# Patient Record
Sex: Female | Born: 1935 | Hispanic: No | Marital: Married | Smoking: Never smoker
Health system: Southern US, Community
[De-identification: ages and names within clinical notes are randomized; demographics above are authoritative.]

## PROBLEM LIST (undated history)

## (undated) DIAGNOSIS — I350 Nonrheumatic aortic (valve) stenosis: Secondary | ICD-10-CM

## (undated) DIAGNOSIS — C801 Malignant (primary) neoplasm, unspecified: Secondary | ICD-10-CM

## (undated) HISTORY — PX: CARDIAC SURGERY: SHX584

## (undated) HISTORY — PX: BREAST SURGERY: SHX581

---

## 2018-01-30 ENCOUNTER — Emergency Department (HOSPITAL_COMMUNITY): Payer: PRIVATE HEALTH INSURANCE

## 2018-01-30 ENCOUNTER — Other Ambulatory Visit: Payer: Self-pay

## 2018-01-30 ENCOUNTER — Inpatient Hospital Stay (HOSPITAL_COMMUNITY)
Admission: EM | Admit: 2018-01-30 | Discharge: 2018-02-01 | DRG: 292 | Disposition: A | Discharge status: Home / Self Care | Payer: PRIVATE HEALTH INSURANCE | Attending: Internal Medicine | Admitting: Internal Medicine

## 2018-01-30 ENCOUNTER — Encounter (HOSPITAL_COMMUNITY): Payer: Self-pay

## 2018-01-30 DIAGNOSIS — R011 Cardiac murmur, unspecified: Secondary | ICD-10-CM | POA: Diagnosis present

## 2018-01-30 DIAGNOSIS — R1310 Dysphagia, unspecified: Secondary | ICD-10-CM | POA: Diagnosis present

## 2018-01-30 DIAGNOSIS — R7989 Other specified abnormal findings of blood chemistry: Secondary | ICD-10-CM | POA: Diagnosis present

## 2018-01-30 DIAGNOSIS — I350 Nonrheumatic aortic (valve) stenosis: Secondary | ICD-10-CM | POA: Diagnosis present

## 2018-01-30 DIAGNOSIS — E041 Nontoxic single thyroid nodule: Secondary | ICD-10-CM | POA: Diagnosis present

## 2018-01-30 DIAGNOSIS — I2510 Atherosclerotic heart disease of native coronary artery without angina pectoris: Secondary | ICD-10-CM | POA: Diagnosis present

## 2018-01-30 DIAGNOSIS — Z7984 Long term (current) use of oral hypoglycemic drugs: Secondary | ICD-10-CM

## 2018-01-30 DIAGNOSIS — J9811 Atelectasis: Secondary | ICD-10-CM | POA: Diagnosis present

## 2018-01-30 DIAGNOSIS — I10 Essential (primary) hypertension: Secondary | ICD-10-CM | POA: Diagnosis not present

## 2018-01-30 DIAGNOSIS — Z66 Do not resuscitate: Secondary | ICD-10-CM | POA: Diagnosis present

## 2018-01-30 DIAGNOSIS — Z853 Personal history of malignant neoplasm of breast: Secondary | ICD-10-CM | POA: Diagnosis not present

## 2018-01-30 DIAGNOSIS — E119 Type 2 diabetes mellitus without complications: Secondary | ICD-10-CM

## 2018-01-30 DIAGNOSIS — R591 Generalized enlarged lymph nodes: Secondary | ICD-10-CM

## 2018-01-30 DIAGNOSIS — R0602 Shortness of breath: Secondary | ICD-10-CM

## 2018-01-30 DIAGNOSIS — I248 Other forms of acute ischemic heart disease: Secondary | ICD-10-CM | POA: Diagnosis present

## 2018-01-30 DIAGNOSIS — E785 Hyperlipidemia, unspecified: Secondary | ICD-10-CM | POA: Diagnosis present

## 2018-01-30 DIAGNOSIS — J90 Pleural effusion, not elsewhere classified: Secondary | ICD-10-CM | POA: Diagnosis not present

## 2018-01-30 DIAGNOSIS — Z951 Presence of aortocoronary bypass graft: Secondary | ICD-10-CM

## 2018-01-30 DIAGNOSIS — I5033 Acute on chronic diastolic (congestive) heart failure: Secondary | ICD-10-CM | POA: Diagnosis present

## 2018-01-30 DIAGNOSIS — I340 Nonrheumatic mitral (valve) insufficiency: Secondary | ICD-10-CM | POA: Diagnosis not present

## 2018-01-30 DIAGNOSIS — I459 Conduction disorder, unspecified: Secondary | ICD-10-CM | POA: Diagnosis present

## 2018-01-30 DIAGNOSIS — I440 Atrioventricular block, first degree: Secondary | ICD-10-CM | POA: Diagnosis present

## 2018-01-30 DIAGNOSIS — Z79899 Other long term (current) drug therapy: Secondary | ICD-10-CM

## 2018-01-30 DIAGNOSIS — R778 Other specified abnormalities of plasma proteins: Secondary | ICD-10-CM | POA: Diagnosis present

## 2018-01-30 DIAGNOSIS — R748 Abnormal levels of other serum enzymes: Secondary | ICD-10-CM | POA: Diagnosis not present

## 2018-01-30 DIAGNOSIS — R918 Other nonspecific abnormal finding of lung field: Secondary | ICD-10-CM | POA: Diagnosis present

## 2018-01-30 DIAGNOSIS — J449 Chronic obstructive pulmonary disease, unspecified: Secondary | ICD-10-CM | POA: Diagnosis present

## 2018-01-30 DIAGNOSIS — I5023 Acute on chronic systolic (congestive) heart failure: Secondary | ICD-10-CM | POA: Diagnosis not present

## 2018-01-30 DIAGNOSIS — R599 Enlarged lymph nodes, unspecified: Secondary | ICD-10-CM | POA: Diagnosis present

## 2018-01-30 DIAGNOSIS — I110 Hypertensive heart disease with heart failure: Principal | ICD-10-CM | POA: Diagnosis present

## 2018-01-30 DIAGNOSIS — E118 Type 2 diabetes mellitus with unspecified complications: Secondary | ICD-10-CM | POA: Diagnosis not present

## 2018-01-30 LAB — CBC WITH DIFFERENTIAL/PLATELET
BASOS ABS: 0 10*3/uL (ref 0.0–0.1)
BASOS PCT: 0 %
EOS ABS: 0.2 10*3/uL (ref 0.0–0.7)
Eosinophils Relative: 3 %
HEMATOCRIT: 35.2 % — AB (ref 36.0–46.0)
Hemoglobin: 11.3 g/dL — ABNORMAL LOW (ref 12.0–15.0)
Lymphocytes Relative: 25 %
Lymphs Abs: 2 10*3/uL (ref 0.7–4.0)
MCH: 29.4 pg (ref 26.0–34.0)
MCHC: 32.1 g/dL (ref 30.0–36.0)
MCV: 91.4 fL (ref 78.0–100.0)
Monocytes Absolute: 0.5 10*3/uL (ref 0.1–1.0)
Monocytes Relative: 6 %
NEUTROS ABS: 5.3 10*3/uL (ref 1.7–7.7)
NEUTROS PCT: 66 %
Platelets: 243 10*3/uL (ref 150–400)
RBC: 3.85 MIL/uL — ABNORMAL LOW (ref 3.87–5.11)
RDW: 14.1 % (ref 11.5–15.5)
WBC: 7.9 10*3/uL (ref 4.0–10.5)

## 2018-01-30 LAB — BASIC METABOLIC PANEL
ANION GAP: 10 (ref 5–15)
BUN: 15 mg/dL (ref 8–23)
CALCIUM: 9.2 mg/dL (ref 8.9–10.3)
CO2: 27 mmol/L (ref 22–32)
Chloride: 105 mmol/L (ref 98–111)
Creatinine, Ser: 0.84 mg/dL (ref 0.44–1.00)
Glucose, Bld: 111 mg/dL — ABNORMAL HIGH (ref 70–99)
Potassium: 4 mmol/L (ref 3.5–5.1)
SODIUM: 142 mmol/L (ref 135–145)

## 2018-01-30 LAB — POCT I-STAT TROPONIN I: TROPONIN I, POC: 0.57 ng/mL — AB (ref 0.00–0.08)

## 2018-01-30 LAB — D-DIMER, QUANTITATIVE (NOT AT ARMC): D DIMER QUANT: 0.86 ug{FEU}/mL — AB (ref 0.00–0.50)

## 2018-01-30 LAB — TROPONIN I: Troponin I: 0.52 ng/mL (ref <0.03)

## 2018-01-30 LAB — BRAIN NATRIURETIC PEPTIDE: B NATRIURETIC PEPTIDE 5: 629.1 pg/mL — AB (ref 0.0–100.0)

## 2018-01-30 LAB — GLUCOSE, CAPILLARY: GLUCOSE-CAPILLARY: 111 mg/dL — AB (ref 70–99)

## 2018-01-30 MED ORDER — IOPAMIDOL (ISOVUE-370) INJECTION 76%
INTRAVENOUS | Status: AC
  Filled 2018-01-30: qty 100

## 2018-01-30 MED ORDER — ATORVASTATIN CALCIUM 40 MG PO TABS
80.0000 mg | ORAL_TABLET | Freq: Every day | ORAL | Status: DC
  Administered 2018-01-30 – 2018-01-31 (×2): 80 mg via ORAL
  Filled 2018-01-30: qty 2
  Filled 2018-01-30: qty 1
  Filled 2018-01-30 (×2): qty 2

## 2018-01-30 MED ORDER — INSULIN ASPART 100 UNIT/ML ~~LOC~~ SOLN: 0.0000 [IU] | Freq: Every day | SUBCUTANEOUS | Status: DC

## 2018-01-30 MED ORDER — IOPAMIDOL (ISOVUE-370) INJECTION 76%
100.0000 mL | Freq: Once | INTRAVENOUS | Status: AC | PRN
  Administered 2018-01-30: 100 mL via INTRAVENOUS

## 2018-01-30 MED ORDER — IPRATROPIUM-ALBUTEROL 0.5-2.5 (3) MG/3ML IN SOLN: 3.0000 mL | RESPIRATORY_TRACT | Status: DC | PRN

## 2018-01-30 MED ORDER — INSULIN ASPART 100 UNIT/ML ~~LOC~~ SOLN: 0.0000 [IU] | Freq: Three times a day (TID) | SUBCUTANEOUS | Status: DC

## 2018-01-30 MED ORDER — ACETAMINOPHEN 325 MG PO TABS: 650.0000 mg | ORAL_TABLET | Freq: Four times a day (QID) | ORAL | Status: DC | PRN

## 2018-01-30 MED ORDER — ACETAMINOPHEN 650 MG RE SUPP: 650.0000 mg | Freq: Four times a day (QID) | RECTAL | Status: DC | PRN

## 2018-01-30 MED ORDER — PANTOPRAZOLE SODIUM 40 MG PO TBEC
40.0000 mg | DELAYED_RELEASE_TABLET | Freq: Every day | ORAL | Status: DC
Start: 2018-01-30 — End: 2018-02-01
  Administered 2018-01-31 – 2018-02-01 (×2): 40 mg via ORAL
  Filled 2018-01-30 (×2): qty 1

## 2018-01-30 MED ORDER — ONDANSETRON HCL 4 MG/2ML IJ SOLN: 4.0000 mg | Freq: Four times a day (QID) | INTRAMUSCULAR | Status: DC | PRN

## 2018-01-30 MED ORDER — TRIAMTERENE-HCTZ 75-50 MG PO TABS: 1.0000 | ORAL_TABLET | Freq: Every day | ORAL | Status: DC

## 2018-01-30 MED ORDER — FUROSEMIDE 10 MG/ML IJ SOLN
40.0000 mg | Freq: Two times a day (BID) | INTRAMUSCULAR | Status: DC
  Administered 2018-01-31: 40 mg via INTRAVENOUS
  Filled 2018-01-30: qty 4

## 2018-01-30 MED ORDER — ONDANSETRON HCL 4 MG PO TABS: 4.0000 mg | ORAL_TABLET | Freq: Four times a day (QID) | ORAL | Status: DC | PRN

## 2018-01-30 MED ORDER — FELODIPINE ER 2.5 MG PO TB24
2.5000 mg | ORAL_TABLET | Freq: Every day | ORAL | Status: DC
  Administered 2018-01-31 – 2018-02-01 (×2): 2.5 mg via ORAL
  Filled 2018-01-30 (×2): qty 1

## 2018-01-30 NOTE — ED Triage Notes (Signed)
She c/o shortness of breath x 2 days. She has recently flown here from [place]. She is in no distress.

## 2018-01-30 NOTE — H&P (Signed)
History and Physical    Katrina Sims QQI:[id number] DOB: 1931-09-25 DOA: 01/30/2018  PCP: Patient, No Pcp Per   Patient coming from: Home  I have personally briefly reviewed patient's old medical records in [place]  Chief Complaint: Shortness of breath  HPI: Katrina Sims is a 84 y.o. female with medical history significant of coronary artery disease status post CABG 20 years ago, breast cancer in remission, hypertension, hyperlipidemia and diabetes presented with complaints of shortness of breath.  Patient lives in [place] and traveled here to visit family 2 days ago.  Patient speaks [place] and speaks very little [place], most of the history is obtained from the son who is translating.  Son reports that patient has been complaining of shortness of breath and fatigue for the last 2 days.  There is no reported history of chest pain, increased leg swelling, decreased urine output, fever, worsening cough, nausea, vomiting or diaphoresis.  Son also reports that patient has lost some weight recently and has difficulty swallowing solid foods occasionally.  Patient had recently seen her family doctor 1 week prior to leaving [place] and was told that everything was fine.  There is no history of diarrhea, dysuria, loss of consciousness, seizures, syncope.  ED Course: Patient was found to have a positive troponin of 0.57.  CT of the chest was negative for pulmonary embolism but showed bilateral pleural effusion, multiple pulmonary nodules, thoracic adenopathy and thyroid mass.  Hospitalist service was called to evaluate the patient  Review of Systems: As per HPI otherwise 10 point review of systems negative.   Past medical and surgical history -Coronary disease status post CABG -Hypertension -diabetes mellitus type 2 -Dyslipidemia -Breast cancer currently in remission  Social history  reports that she has never smoked. She has never used smokeless tobacco. Her alcohol and drug  histories are not on file.  -Patient lives in [place] and is currently visiting her family  No Known Allergies  Family history -No history of TB  Prior to Admission medications   Medication Sig Start Date End Date Taking? Authorizing Provider  amiloride-hydrochlorothiazide (MODURETIC) 5-50 MG tablet Take 0.5 tablets by mouth daily.   Yes [provider]  atorvastatin (LIPITOR) 80 MG tablet Take 80 mg by mouth daily.   Yes [provider]  felodipine (PLENDIL) 2.5 MG 24 hr tablet Take 2.5 mg by mouth daily.   Yes [provider]  metFORMIN (GLUCOPHAGE) 850 MG tablet Take 850 mg by mouth 2 (two) times daily with a meal.   Yes [provider]  OMEPRAZOLE PO Take 1 capsule by mouth daily.   Yes [provider]  PRESCRIPTION MEDICATION Take 200 mg by mouth daily. [place] Cardio = ASA/Aluminum/Magnesium. Dosage forms 200/60/30 or 100/30/15 [place] product of Aspirin plus Antacid   Yes [provider]  Pyridoxine HCl (VITAMIN B-6 PO) Take 1 tablet by mouth daily.   Yes [provider]    Physical Exam: Vitals:   01/30/18 1232 01/30/18 1315 01/30/18 1500  BP: 134/64 124/60 131/71  Pulse: 95  81  Resp: 16 (!) 28 (!) 26  Temp: 97.7 F (36.5 C)    SpO2: 98%  99%    Constitutional: NAD, calm, comfortable.  Thinly built elderly female lying in bed Vitals:   01/30/18 1232 01/30/18 1315 01/30/18 1500  BP: 134/64 124/60 131/71  Pulse: 95  81  Resp: 16 (!) 28 (!) 26  Temp: 97.7 F (36.5 C)    SpO2: 98%  99%  Eyes: PERRL, lids and conjunctivae normal ENMT: Mucous membranes are moist. Posterior pharynx clear of any exudate or lesions. Neck: normal, supple, no masses, no thyromegaly Respiratory: bilateral decreased breath sounds at bases, with basilar crackles but no wheezing.  Normal respiratory effort. No accessory muscle use.  Intermittent tachypnea Cardiovascular: S1 S2 positive, rate controlled. No extremity edema. 2+  pedal pulses.  Abdomen: no tenderness, no masses palpated. No hepatosplenomegaly. Bowel sounds positive.  Musculoskeletal: no clubbing / cyanosis. No joint deformity upper and lower extremities.  Skin: no rashes, lesions, ulcers. No induration Neurologic: CN 2-12 grossly intact. Moving extremities. No focal neurologic deficits.  Lymph: No cervical lymphadenopathy   Labs on Admission: I have personally reviewed following labs and imaging studies  CBC: Recent Labs  Lab 01/30/18 1339  WBC 7.9  NEUTROABS 5.3  HGB 11.3*  HCT 35.2*  MCV 91.4  PLT 409   Basic Metabolic Panel: Recent Labs  Lab 01/30/18 1339  NA 142  K 4.0  CL 105  CO2 27  GLUCOSE 111*  BUN 15  CREATININE 0.84  CALCIUM 9.2   GFR: CrCl cannot be calculated (Unknown ideal weight.). Liver Function Tests: No results for input(s): AST, ALT, ALKPHOS, BILITOT, PROT, ALBUMIN in the last 168 hours. No results for input(s): LIPASE, AMYLASE in the last 168 hours. No results for input(s): AMMONIA in the last 168 hours. Coagulation Profile: No results for input(s): INR, PROTIME in the last 168 hours. Cardiac Enzymes: No results for input(s): CKTOTAL, CKMB, CKMBINDEX, TROPONINI in the last 168 hours. BNP (last 3 results) No results for input(s): PROBNP in the last 8760 hours. HbA1C: No results for input(s): HGBA1C in the last 72 hours. CBG: No results for input(s): GLUCAP in the last 168 hours. Lipid Profile: No results for input(s): CHOL, HDL, LDLCALC, TRIG, CHOLHDL, LDLDIRECT in the last 72 hours. Thyroid Function Tests: No results for input(s): TSH, T4TOTAL, FREET4, T3FREE, THYROIDAB in the last 72 hours. Anemia Panel: No results for input(s): VITAMINB12, FOLATE, FERRITIN, TIBC, IRON, RETICCTPCT in the last 72 hours. Urine analysis: No results found for: COLORURINE, APPEARANCEUR, LABSPEC, PHURINE, GLUCOSEU, HGBUR, BILIRUBINUR, KETONESUR, PROTEINUR, UROBILINOGEN, NITRITE, LEUKOCYTESUR  Radiological Exams on  Admission: Dg Chest 2 View  Result Date: 01/30/2018 CLINICAL DATA:  Acute shortness of breath for 2 days. EXAM: CHEST - 2 VIEW COMPARISON:  None. FINDINGS: UPPER limits normal heart size and CABG changes noted. Mild peribronchial thickening and mild hyperinflation noted. There is no evidence of focal airspace disease, pulmonary edema, suspicious pulmonary nodule/mass, pleural effusion, or pneumothorax. No acute bony abnormalities are identified. IMPRESSION: UPPER limits normal heart size without definite acute abnormality. Mild peribronchial thickening (likely chronic) and mild hyperinflation. Electronically Signed   By: Margarette Canada M.D.   On: 01/30/2018 13:28   Ct Angio Chest Pe W And/or Wo Contrast  Result Date: 01/30/2018 CLINICAL DATA:  Shortness of breath EXAM: CT ANGIOGRAPHY CHEST WITH CONTRAST TECHNIQUE: Multidetector CT imaging of the chest was performed using the standard protocol during bolus administration of intravenous contrast. Multiplanar CT image reconstructions and MIPs were obtained to evaluate the vascular anatomy. CONTRAST:  90 mL ISOVUE-370 IOPAMIDOL (ISOVUE-370) INJECTION 76% COMPARISON:  Chest radiograph January 30, 2018 FINDINGS: Cardiovascular: There is no demonstrable pulmonary embolus. There is no thoracic aortic aneurysm. No dissection evident, although the contrast bolus is less than optimal for assessment for potential thoracic aortic dissection. There are patchy areas of calcification in the proximal great vessels. Patchy calcification is also noted in mid great vessel regions.  There is extensive aortic atherosclerosis throughout the aorta. There are foci of native coronary artery calcification. Patient is status post coronary artery bypass grafting. There is no pericardial effusion or pericardial thickening evident. The main pulmonary outflow tract measures 3.1 cm, prominent. Mediastinum/Nodes: There is diffuse thyroid enlargement with multiple masses arising from the thyroid,  largest measuring 3.1 x 2.7 cm on the right. There is adenopathy in the aortopulmonary window region with the largest lymph node measuring 1.9 x 1.1 cm. There is a lymph node to the left of the distal trachea measuring 1.5 x 1.2 cm. There is extensive adenopathy in the subcarinal region with a conglomeration of lymph nodes measuring 3.2 x 2.2 cm. Lungs/Pleura: There are moderate free-flowing pleural effusions bilaterally. There are scattered areas of apparent lung scarring. There is a nodular opacity in the anterior segment of the right middle lobe measuring 9 x 7 mm, best seen on axial slice 86 series 6. There is a nodular opacity in the lateral segment of the right middle lobe measuring 9 x 8 mm, best seen on axial slice 97 series 6. There is a nodular opacity in the anterior segment of the left lower lobe, best seen on axial slice 98 series 6 measuring 10 x 9 mm. There is a nodular opacity in the lateral segment of the left lower lobe seen on axial slice 093 series 6 measuring 8 x 7 mm. There is an ill-defined nodular opacity in the inferior lingula best seen on axial slice 91 series 6 measuring 10 x 9 mm. There is an ill-defined nodular opacity in the lateral segment right lower lobe on axial slice 99 series 6 measuring 5 x 4 mm. There is patchy atelectatic change in both upper and lower lobes. There is no frank airspace consolidation. There is a degree of underlying centrilobular emphysema. Upper Abdomen: There is upper abdominal atherosclerotic plaque and calcification. Visualized upper abdominal structures otherwise appear unremarkable. Adrenals and particular appear normal. Musculoskeletal: Patient is status post median sternotomy. No blastic or lytic bone lesions are evident. No chest wall lesions are appreciable. Review of the MIP images confirms the above findings. IMPRESSION: 1. No demonstrable pulmonary embolus. No thoracic aortic aneurysm or dissection. Note that the contrast bolus is less than optimal  for assessment for potential dissection. There is aortic atherosclerosis. Patient is status post coronary artery bypass grafting. There are multiple foci of great vessel calcification. 2. Several scattered pulmonary nodular lesions, largest measuring approximately 1 cm. This finding, in concert with the adenopathy, warrants further evaluation. Advise PET-CT examination to further assess given concern for neoplastic etiology. 3. Multifocal thoracic adenopathy, most marked in the subcarinal region or adenopathy measures 3.2 x 2.2 cm. Correlation with PET-CT examination is felt to be warranted in this circumstance. 4. Moderate free-flowing pleural effusions bilaterally. Areas of atelectatic change. No frank consolidation. Underlying centrilobular emphysematous change noted. 5. Prominence of the main pulmonary outflow tract, a finding felt to represent a degree of pulmonary arterial hypertension. 6. **An incidental finding of potential clinical significance has been found. Diffusely enlarged thyroid with dominant masses, largest on the right measuring 3.1 x 2.7 cm. Consider further evaluation with nonemergent thyroid ultrasound. If patient is clinically hyperthyroid, consider nuclear medicine thyroid uptake and scan.** it should be noted that thyroid neoplasm potentially could cause adenopathy and parenchymal lung nodular lesions. Aortic Atherosclerosis (ICD10-I70.0) and Emphysema (ICD10-J43.9). Electronically Signed   By: Lowella Grip III M.D.   On: 01/30/2018 15:07    EKG: Independently reviewed.  Normal sinus rhythm with no ST elevations or depressions  Assessment/Plan Active Problems:   Bilateral pleural effusion   Lung nodules   Adenopathy   Elevated troponin   HTN (hypertension)   Diabetes mellitus type 2, controlled (HCC)   Shortness of breath most likely due to combination of pleural effusion/lung nodule/thoracic adenopathy -Currently not requiring oxygen segmentation.  Use oxygen if needed;  plan as below  Bilateral pleural effusions -Unclear if this is related to malignancy.  Currently no signs of heart failure. -We will request ultrasound-guided thoracentesis and pleural fluid to be sent for analysis including protein/LDH/cytology/Gram stain/cultures.  Spoke to Dr. Minna Merritts and he agreed that patient needed a thoracentesis and requested IR guided thoracentesis and consult pulmonary officially once thoracentesis is done -2D echo. -We will empirically start intravenous Lasix to see if it might help with pleural effusions.  Pulmonary nodules with thoracic adenopathy with thyroid mass -Spoke to Dr. Sherill/oncology about the patient and he recommended that patient would benefit from thoracentesis for tissue diagnosis.  He stated that it would be rare for thyroid cancer to present with pulmonary metastases and thought that it might be breast cancer with metastases.  Oncology needs to be officially consulted once we have a tissue diagnosis  Positive troponins in a patient with history of coronary artery disease and CABG -Most likely secondary demand ischemia from shortness of breath/pleural effusions.  No active chest pain.  EKG is unremarkable.  Cycle troponins.  2D echo.  Might need cardiology evaluation.  N.p.o. past midnight  Hypertension -Monitor blood pressure.  Continue home regimen  Diabetes mellitus type 2 -Hold metformin.  Accu-Cheks with insulin sliding scale coverage  Hyperlipidemia -Continue statin  DVT prophylaxis: SCDs.  Avoid Lovenox as patient might need thoracentesis Code Status: Full code as confirmed by the patient in front of the son Family Communication: Spoke to son at bedside Disposition Plan: Home once clinically improved Consults called: Spoke to Dr. Sherill/oncology and Dr. Minna Merritts on phone Admission status: Inpatient/telemetry  Severity of Illness: The appropriate patient status for this patient is INPATIENT. Inpatient status is judged  to be reasonable and necessary in order to provide the required intensity of service to ensure the patient's safety. The patient's presenting symptoms, physical exam findings, and initial radiographic and laboratory data in the context of their chronic comorbidities is felt to place them at high risk for further clinical deterioration. Furthermore, it is not anticipated that the patient will be medically stable for discharge from the hospital within 2 midnights of admission. The following factors support the patient status of inpatient.   " The patient's presenting symptoms include shortness of breath. " The worrisome physical exam findings include intermittent tachypnea. " The initial radiographic and laboratory data are worrisome because of pleural effusions/pulmonary nodule/thoracic adenopathy/thyroid mass/positive troponins. " The chronic co-morbidities include history of coronary artery disease and CABG/hypertension/diabetes.   * I certify that at the point of admission it is my clinical judgment that the patient will require inpatient hospital care spanning beyond 2 midnights from the point of admission due to high intensity of service, high risk for further deterioration and high frequency of surveillance required.Aline August MD [facility] Pager [phone]  If 7PM-7AM, please contact night-coverage [email or web address] Password [facility]  01/30/2018, 4:54 PM

## 2018-01-30 NOTE — Progress Notes (Signed)
CRITICAL VALUE ALERT  Critical Value:  Troponin 0.52  Date & Time Notied: 01/30/2018  Provider Notified: OnCall   Orders Received/Actions taken:

## 2018-01-30 NOTE — ED Provider Notes (Signed)
[place] DEPT Provider Note   CSN: [id number] Arrival date & time: 01/30/18  1218     History   Chief Complaint Chief Complaint  Patient presents with  . Shortness of Breath    HPI Katrina Sims is a 84 y.o. female.  84 year old female with prior medical history significant for CAD, breast cancer (in remission), hypertension, hyperlipidemia, and diabetes presents with complaint of shortness of breath.  Patient resides in [place] and traveled here to visit family 2 days prior.  49 of history is obtained through the patient's son who is translating.  Patient speaks [place].  Son reports that the patient has been complaining of feeling short of breath and fatigue for the last 2 days.  No reported fever.  No reported chest pain, nausea, vomiting, and/or diaphoresis.  Patient reportedly saw her family doctor approximately 1 week prior to leaving [place] and was told that everything was "fine."  The history is provided by the patient and a relative.  Shortness of Breath  This is a new problem. The average episode lasts 2 days. The problem occurs rarely.The current episode started 2 days ago. The problem has not changed since onset.Pertinent negatives include no fever, no cough, no chest pain and no leg pain. She has tried nothing for the symptoms.    History reviewed. No pertinent past medical history.  There are no active problems to display for this patient.   OB History   None      Home Medications    Prior to Admission medications   Medication Sig Start Date End Date Taking? Authorizing Provider  amiloride-hydrochlorothiazide (MODURETIC) 5-50 MG tablet Take 0.5 tablets by mouth daily.   Yes [provider]  atorvastatin (LIPITOR) 80 MG tablet Take 80 mg by mouth daily.   Yes [provider]  felodipine (PLENDIL) 2.5 MG 24 hr tablet Take 2.5 mg by mouth daily.   Yes [provider]  metFORMIN  (GLUCOPHAGE) 850 MG tablet Take 850 mg by mouth 2 (two) times daily with a meal.   Yes [provider]  OMEPRAZOLE PO Take 1 capsule by mouth daily.   Yes [provider]  PRESCRIPTION MEDICATION Take 200 mg by mouth daily. [place] [facility]   Yes [provider]  Pyridoxine HCl (VITAMIN B-6 PO) Take 1 tablet by mouth daily.   Yes [provider]    Family History No family history on file.  Social History Social History   Tobacco Use  . Smoking status: Never Smoker  . Smokeless tobacco: Never Used  Substance Use Topics  . Alcohol use: Not on file  . Drug use: Not on file     Allergies   Patient has no known allergies.   Review of Systems Review of Systems  Constitutional: Negative for fever.  Respiratory: Positive for shortness of breath. Negative for cough.   Cardiovascular: Negative for chest pain.  All other systems reviewed and are negative.    Physical Exam Updated Vital Signs BP 124/60   Pulse 95   Temp 97.7 F (36.5 C)   Resp (!) 28   SpO2 98%   Physical Exam  Constitutional: She is oriented to person, place, and time. She appears well-developed and well-nourished. No distress.  HENT:  Head: Normocephalic and atraumatic.  Mouth/Throat: Oropharynx is clear and moist.  Eyes: Pupils are equal, round, and reactive to light. Conjunctivae and EOM are normal.  Neck: Normal range of motion. Neck supple. Thyromegaly present.  Cardiovascular:  Normal rate, regular rhythm and normal heart sounds.  Pulmonary/Chest: Effort normal and breath sounds normal. No respiratory distress.  Abdominal: Soft. She exhibits no distension. There is no tenderness.  Musculoskeletal: Normal range of motion. She exhibits no edema or deformity.  Neurological: She is alert and oriented to person, place, and time.  Skin: Skin is warm and dry.  Psychiatric: She has a normal mood and affect.  Nursing note and vitals reviewed.    ED Treatments / Results   Labs (all labs ordered are listed, but only abnormal results are displayed) Labs Reviewed  D-DIMER, QUANTITATIVE (NOT AT [facility]) - Abnormal; Notable for the following components:      Result Value   D-Dimer, Quant 0.86 (*)    All other components within normal limits  CBC WITH DIFFERENTIAL/PLATELET - Abnormal; Notable for the following components:   RBC 3.85 (*)    Hemoglobin 11.3 (*)    HCT 35.2 (*)    All other components within normal limits  BASIC METABOLIC PANEL - Abnormal; Notable for the following components:   Glucose, Bld 111 (*)    All other components within normal limits  BRAIN NATRIURETIC PEPTIDE - Abnormal; Notable for the following components:   B Natriuretic Peptide 629.1 (*)    All other components within normal limits  POCT I-STAT TROPONIN I - Abnormal; Notable for the following components:   Troponin i, poc 0.57 (*)    All other components within normal limits  I-STAT TROPONIN, ED    EKG None  Radiology Dg Chest 2 View  Result Date: 01/30/2018 CLINICAL DATA:  Acute shortness of breath for 2 days. EXAM: CHEST - 2 VIEW COMPARISON:  None. FINDINGS: UPPER limits normal heart size and CABG changes noted. Mild peribronchial thickening and mild hyperinflation noted. There is no evidence of focal airspace disease, pulmonary edema, suspicious pulmonary nodule/mass, pleural effusion, or pneumothorax. No acute bony abnormalities are identified. IMPRESSION: UPPER limits normal heart size without definite acute abnormality. Mild peribronchial thickening (likely chronic) and mild hyperinflation. Electronically Signed   By: Margarette Canada M.D.   On: 01/30/2018 13:28   Ct Angio Chest Pe W And/or Wo Contrast  Result Date: 01/30/2018 CLINICAL DATA:  Shortness of breath EXAM: CT ANGIOGRAPHY CHEST WITH CONTRAST TECHNIQUE: Multidetector CT imaging of the chest was performed using the standard protocol during bolus administration of intravenous contrast. Multiplanar CT image  reconstructions and MIPs were obtained to evaluate the vascular anatomy. CONTRAST:  90 mL ISOVUE-370 IOPAMIDOL (ISOVUE-370) INJECTION 76% COMPARISON:  Chest radiograph January 30, 2018 FINDINGS: Cardiovascular: There is no demonstrable pulmonary embolus. There is no thoracic aortic aneurysm. No dissection evident, although the contrast bolus is less than optimal for assessment for potential thoracic aortic dissection. There are patchy areas of calcification in the proximal great vessels. Patchy calcification is also noted in mid great vessel regions. There is extensive aortic atherosclerosis throughout the aorta. There are foci of native coronary artery calcification. Patient is status post coronary artery bypass grafting. There is no pericardial effusion or pericardial thickening evident. The main pulmonary outflow tract measures 3.1 cm, prominent. Mediastinum/Nodes: There is diffuse thyroid enlargement with multiple masses arising from the thyroid, largest measuring 3.1 x 2.7 cm on the right. There is adenopathy in the aortopulmonary window region with the largest lymph node measuring 1.9 x 1.1 cm. There is a lymph node to the left of the distal trachea measuring 1.5 x 1.2 cm. There is extensive adenopathy in the subcarinal region with a conglomeration of  lymph nodes measuring 3.2 x 2.2 cm. Lungs/Pleura: There are moderate free-flowing pleural effusions bilaterally. There are scattered areas of apparent lung scarring. There is a nodular opacity in the anterior segment of the right middle lobe measuring 9 x 7 mm, best seen on axial slice 86 series 6. There is a nodular opacity in the lateral segment of the right middle lobe measuring 9 x 8 mm, best seen on axial slice 97 series 6. There is a nodular opacity in the anterior segment of the left lower lobe, best seen on axial slice 98 series 6 measuring 10 x 9 mm. There is a nodular opacity in the lateral segment of the left lower lobe seen on axial slice 341 series 6  measuring 8 x 7 mm. There is an ill-defined nodular opacity in the inferior lingula best seen on axial slice 91 series 6 measuring 10 x 9 mm. There is an ill-defined nodular opacity in the lateral segment right lower lobe on axial slice 99 series 6 measuring 5 x 4 mm. There is patchy atelectatic change in both upper and lower lobes. There is no frank airspace consolidation. There is a degree of underlying centrilobular emphysema. Upper Abdomen: There is upper abdominal atherosclerotic plaque and calcification. Visualized upper abdominal structures otherwise appear unremarkable. Adrenals and particular appear normal. Musculoskeletal: Patient is status post median sternotomy. No blastic or lytic bone lesions are evident. No chest wall lesions are appreciable. Review of the MIP images confirms the above findings. IMPRESSION: 1. No demonstrable pulmonary embolus. No thoracic aortic aneurysm or dissection. Note that the contrast bolus is less than optimal for assessment for potential dissection. There is aortic atherosclerosis. Patient is status post coronary artery bypass grafting. There are multiple foci of great vessel calcification. 2. Several scattered pulmonary nodular lesions, largest measuring approximately 1 cm. This finding, in concert with the adenopathy, warrants further evaluation. Advise PET-CT examination to further assess given concern for neoplastic etiology. 3. Multifocal thoracic adenopathy, most marked in the subcarinal region or adenopathy measures 3.2 x 2.2 cm. Correlation with PET-CT examination is felt to be warranted in this circumstance. 4. Moderate free-flowing pleural effusions bilaterally. Areas of atelectatic change. No frank consolidation. Underlying centrilobular emphysematous change noted. 5. Prominence of the main pulmonary outflow tract, a finding felt to represent a degree of pulmonary arterial hypertension. 6. **An incidental finding of potential clinical significance has been found.  Diffusely enlarged thyroid with dominant masses, largest on the right measuring 3.1 x 2.7 cm. Consider further evaluation with nonemergent thyroid ultrasound. If patient is clinically hyperthyroid, consider nuclear medicine thyroid uptake and scan.** it should be noted that thyroid neoplasm potentially could cause adenopathy and parenchymal lung nodular lesions. Aortic Atherosclerosis (ICD10-I70.0) and Emphysema (ICD10-J43.9). Electronically Signed   By: Lowella Grip III M.D.   On: 01/30/2018 15:07    Procedures Procedures (including critical care time)  Medications Ordered in ED Medications  iopamidol (ISOVUE-370) 76 % injection (has no administration in time range)  iopamidol (ISOVUE-370) 76 % injection 100 mL (100 mLs Intravenous Contrast Given 01/30/18 1425)     Initial Impression / Assessment and Plan / ED Course  I have reviewed the triage vital signs and the nursing notes.  Pertinent labs & imaging results that were available during my care of the patient were reviewed by me and considered in my medical decision making (see chart for details).     MDM  Screen complete  Patient is presenting for evaluation of reported shortness of breath.  Initial  exam is reassuring.  However, patient with mildly elevated troponin and d-dimer on initial labs.  CTA does not reveal PE.  However, there is evidence of an enlarged thyroid and multiple areas of lymphadenopathy in the chest with bilateral pleural effusions.  Patient with known history of CAD and it is unclear what recent testing she may have had in [place].   Patient requires admission for further workup and treatment.   Hospitalist service is aware of the case and will evaluate for admssion.   Final Clinical Impressions(s) / ED Diagnoses   Final diagnoses:  SOB (shortness of breath)    ED Discharge Orders    None       Valarie Merino, MD 01/30/18 [phone]

## 2018-01-30 NOTE — ED Notes (Signed)
ED provider Messick made aware patient has critical troponin value of 0.57

## 2018-01-30 NOTE — ED Notes (Signed)
ED TO INPATIENT HANDOFF REPORT  Name/Age/Gender Katrina Sims 84 y.o. female  Code Status    Code Status Orders  (From admission, onward)         Start     Ordered   01/30/18 1648  Do not attempt resuscitation (DNR)  Continuous    Question Answer Comment  In the event of cardiac or respiratory ARREST Do not call a "code blue"   In the event of cardiac or respiratory ARREST Do not perform Intubation, CPR, defibrillation or ACLS   In the event of cardiac or respiratory ARREST Use medication by any route, position, wound care, and other measures to relive pain and suffering. May use oxygen, suction and manual treatment of airway obstruction as needed for comfort.      01/30/18 1653        Code Status History    This patient has a current code status but no historical code status.      Home/SNF/Other Home  Chief Complaint shortness of breath  Level of Care/Admitting Diagnosis ED Disposition    ED Disposition Condition Comment   Admit  Hospital Area: [facility] [[id number]]  Level of Care: Telemetry [5]  Admit to tele based on following criteria: Other see comments  Comments: positive troponin  Diagnosis: Bilateral pleural effusion [993570]  Admitting Physician: Aline August [[id number]]  Attending Physician: Aline August [[id number]]  Estimated length of stay: 3 - 4 days  Certification:: I certify this patient will need inpatient services for at least 2 midnights  PT Class (Do Not Modify): Inpatient [101]  PT Acc Code (Do Not Modify): Private [1]       Medical History History reviewed. No pertinent past medical history.  Allergies No Known Allergies  IV Location/Drains/Wounds Patient Lines/Drains/Airways Status   Active Line/Drains/Airways    Name:   Placement date:   Placement time:   Site:   Days:   Peripheral IV 01/30/18 Right Forearm   01/30/18    1333    Forearm   less than 1          Labs/Imaging Results for orders placed or  performed during the hospital encounter of 01/30/18 (from the past 48 hour(s))  POCT i-Stat troponin I     Status: Abnormal   Collection Time: 01/30/18  1:36 PM  Result Value Ref Range   Troponin i, poc 0.57 (HH) 0.00 - 0.08 ng/mL   Comment NOTIFIED PHYSICIAN    Comment 3            Comment: Due to the release kinetics of cTnI, a negative result within the first hours of the onset of symptoms does not rule out myocardial infarction with certainty. If myocardial infarction is still suspected, repeat the test at appropriate intervals.   D-dimer, quantitative     Status: Abnormal   Collection Time: 01/30/18  1:39 PM  Result Value Ref Range   D-Dimer, Quant 0.86 (H) 0.00 - 0.50 ug/mL-FEU    Comment: (NOTE) At the manufacturer cut-off of 0.50 ug/mL FEU, this assay has been documented to exclude PE with a sensitivity and negative predictive value of 97 to 99%.  At this time, this assay has not been approved by the FDA to exclude DVT/VTE. Results should be correlated with clinical presentation. Performed at [facility], [place] [address], [place], [place] [address]   CBC with Differential     Status: Abnormal   Collection Time: 01/30/18  1:39 PM  Result Value Ref  Range   WBC 7.9 4.0 - 10.5 K/uL   RBC 3.85 (L) 3.87 - 5.11 MIL/uL   Hemoglobin 11.3 (L) 12.0 - 15.0 g/dL   HCT 35.2 (L) 36.0 - 46.0 %   MCV 91.4 78.0 - 100.0 fL   MCH 29.4 26.0 - 34.0 pg   MCHC 32.1 30.0 - 36.0 g/dL   RDW 14.1 11.5 - 15.5 %   Platelets 243 150 - 400 K/uL   Neutrophils Relative % 66 %   Neutro Abs 5.3 1.7 - 7.7 K/uL   Lymphocytes Relative 25 %   Lymphs Abs 2.0 0.7 - 4.0 K/uL   Monocytes Relative 6 %   Monocytes Absolute 0.5 0.1 - 1.0 K/uL   Eosinophils Relative 3 %   Eosinophils Absolute 0.2 0.0 - 0.7 K/uL   Basophils Relative 0 %   Basophils Absolute 0.0 0.0 - 0.1 K/uL    Comment: Performed at [facility], [place] [address] [place], [place] [address]  Basic  metabolic panel     Status: Abnormal   Collection Time: 01/30/18  1:39 PM  Result Value Ref Range   Sodium 142 135 - 145 mmol/L   Potassium 4.0 3.5 - 5.1 mmol/L   Chloride 105 98 - 111 mmol/L   CO2 27 22 - 32 mmol/L   Glucose, Bld 111 (H) 70 - 99 mg/dL   BUN 15 8 - 23 mg/dL   Creatinine, Ser 0.84 0.44 - 1.00 mg/dL   Calcium 9.2 8.9 - 10.3 mg/dL   GFR calc non Af Amer >60 >60 mL/min   GFR calc Af Amer >60 >60 mL/min    Comment: (NOTE) The eGFR has been calculated using the CKD EPI equation. This calculation has not been validated in all clinical situations. eGFR's persistently <60 mL/min signify possible Chronic Kidney Disease.    Anion gap 10 5 - 15    Comment: Performed at [facility], [place] [address] [place], [place] [address]  Brain natriuretic peptide     Status: Abnormal   Collection Time: 01/30/18  1:39 PM  Result Value Ref Range   B Natriuretic Peptide 629.1 (H) 0.0 - 100.0 pg/mL    Comment: Performed at [facility], [place] [address], [place], [place] [address]   Dg Chest 2 View  Result Date: 01/30/2018 CLINICAL DATA:  Acute shortness of breath for 2 days. EXAM: CHEST - 2 VIEW COMPARISON:  None. FINDINGS: UPPER limits normal heart size and CABG changes noted. Mild peribronchial thickening and mild hyperinflation noted. There is no evidence of focal airspace disease, pulmonary edema, suspicious pulmonary nodule/mass, pleural effusion, or pneumothorax. No acute bony abnormalities are identified. IMPRESSION: UPPER limits normal heart size without definite acute abnormality. Mild peribronchial thickening (likely chronic) and mild hyperinflation. Electronically Signed   By: Margarette Canada M.D.   On: 01/30/2018 13:28   Ct Angio Chest Pe W And/or Wo Contrast  Result Date: 01/30/2018 CLINICAL DATA:  Shortness of breath EXAM: CT ANGIOGRAPHY CHEST WITH CONTRAST TECHNIQUE: Multidetector CT imaging of the chest was performed using the standard protocol  during bolus administration of intravenous contrast. Multiplanar CT image reconstructions and MIPs were obtained to evaluate the vascular anatomy. CONTRAST:  90 mL ISOVUE-370 IOPAMIDOL (ISOVUE-370) INJECTION 76% COMPARISON:  Chest radiograph January 30, 2018 FINDINGS: Cardiovascular: There is no demonstrable pulmonary embolus. There is no thoracic aortic aneurysm. No dissection evident, although the contrast bolus is less than optimal for assessment for potential thoracic aortic dissection. There are patchy  areas of calcification in the proximal great vessels. Patchy calcification is also noted in mid great vessel regions. There is extensive aortic atherosclerosis throughout the aorta. There are foci of native coronary artery calcification. Patient is status post coronary artery bypass grafting. There is no pericardial effusion or pericardial thickening evident. The main pulmonary outflow tract measures 3.1 cm, prominent. Mediastinum/Nodes: There is diffuse thyroid enlargement with multiple masses arising from the thyroid, largest measuring 3.1 x 2.7 cm on the right. There is adenopathy in the aortopulmonary window region with the largest lymph node measuring 1.9 x 1.1 cm. There is a lymph node to the left of the distal trachea measuring 1.5 x 1.2 cm. There is extensive adenopathy in the subcarinal region with a conglomeration of lymph nodes measuring 3.2 x 2.2 cm. Lungs/Pleura: There are moderate free-flowing pleural effusions bilaterally. There are scattered areas of apparent lung scarring. There is a nodular opacity in the anterior segment of the right middle lobe measuring 9 x 7 mm, best seen on axial slice 86 series 6. There is a nodular opacity in the lateral segment of the right middle lobe measuring 9 x 8 mm, best seen on axial slice 97 series 6. There is a nodular opacity in the anterior segment of the left lower lobe, best seen on axial slice 98 series 6 measuring 10 x 9 mm. There is a nodular opacity in  the lateral segment of the left lower lobe seen on axial slice 833 series 6 measuring 8 x 7 mm. There is an ill-defined nodular opacity in the inferior lingula best seen on axial slice 91 series 6 measuring 10 x 9 mm. There is an ill-defined nodular opacity in the lateral segment right lower lobe on axial slice 99 series 6 measuring 5 x 4 mm. There is patchy atelectatic change in both upper and lower lobes. There is no frank airspace consolidation. There is a degree of underlying centrilobular emphysema. Upper Abdomen: There is upper abdominal atherosclerotic plaque and calcification. Visualized upper abdominal structures otherwise appear unremarkable. Adrenals and particular appear normal. Musculoskeletal: Patient is status post median sternotomy. No blastic or lytic bone lesions are evident. No chest wall lesions are appreciable. Review of the MIP images confirms the above findings. IMPRESSION: 1. No demonstrable pulmonary embolus. No thoracic aortic aneurysm or dissection. Note that the contrast bolus is less than optimal for assessment for potential dissection. There is aortic atherosclerosis. Patient is status post coronary artery bypass grafting. There are multiple foci of great vessel calcification. 2. Several scattered pulmonary nodular lesions, largest measuring approximately 1 cm. This finding, in concert with the adenopathy, warrants further evaluation. Advise PET-CT examination to further assess given concern for neoplastic etiology. 3. Multifocal thoracic adenopathy, most marked in the subcarinal region or adenopathy measures 3.2 x 2.2 cm. Correlation with PET-CT examination is felt to be warranted in this circumstance. 4. Moderate free-flowing pleural effusions bilaterally. Areas of atelectatic change. No frank consolidation. Underlying centrilobular emphysematous change noted. 5. Prominence of the main pulmonary outflow tract, a finding felt to represent a degree of pulmonary arterial hypertension. 6.  **An incidental finding of potential clinical significance has been found. Diffusely enlarged thyroid with dominant masses, largest on the right measuring 3.1 x 2.7 cm. Consider further evaluation with nonemergent thyroid ultrasound. If patient is clinically hyperthyroid, consider nuclear medicine thyroid uptake and scan.** it should be noted that thyroid neoplasm potentially could cause adenopathy and parenchymal lung nodular lesions. Aortic Atherosclerosis (ICD10-I70.0) and Emphysema (ICD10-J43.9). Electronically Signed  By: Lowella Grip III M.D.   On: 01/30/2018 15:07    Pending Labs Unresulted Labs (From admission, onward)    Start     Ordered   01/31/18 0500  TSH  Tomorrow morning,   R     01/30/18 1653   01/31/18 0500  Comprehensive metabolic panel  Tomorrow morning,   R     01/30/18 1653   01/31/18 0500  CBC  Tomorrow morning,   R     01/30/18 1653   01/31/18 0500  T3, free  Tomorrow morning,   R     01/30/18 1653   01/31/18 0500  T4, free  Tomorrow morning,   R     01/30/18 1653   01/31/18 0500  Hemoglobin A1c  Tomorrow morning,   R    Comments:  To assess prior glycemic control    01/30/18 1653   01/30/18 1649  Troponin I  Now then every 6 hours,   R     01/30/18 1653          Vitals/Pain Today's Vitals   01/30/18 1240 01/30/18 1314 01/30/18 1315 01/30/18 1500  BP:   124/60 131/71  Pulse:    81  Resp:   (!) 28 (!) 26  Temp:      SpO2:    99%  PainSc: 0-No pain 0-No pain      Isolation Precautions No active isolations  Medications Medications  atorvastatin (LIPITOR) tablet 80 mg (has no administration in time range)  felodipine (PLENDIL) 24 hr tablet 2.5 mg (has no administration in time range)  pantoprazole (PROTONIX) EC tablet 40 mg (has no administration in time range)  acetaminophen (TYLENOL) tablet 650 mg (has no administration in time range)    Or  acetaminophen (TYLENOL) suppository 650 mg (has no administration in time range)  ondansetron  (ZOFRAN) tablet 4 mg (has no administration in time range)    Or  ondansetron (ZOFRAN) injection 4 mg (has no administration in time range)  insulin aspart (novoLOG) injection 0-9 Units (has no administration in time range)  insulin aspart (novoLOG) injection 0-5 Units (has no administration in time range)  furosemide (LASIX) injection 40 mg (has no administration in time range)  iopamidol (ISOVUE-370) 76 % injection 100 mL (100 mLs Intravenous Contrast Given 01/30/18 1425)    Mobility walks

## 2018-01-31 ENCOUNTER — Inpatient Hospital Stay (HOSPITAL_COMMUNITY): Payer: PRIVATE HEALTH INSURANCE

## 2018-01-31 DIAGNOSIS — I10 Essential (primary) hypertension: Secondary | ICD-10-CM

## 2018-01-31 DIAGNOSIS — I340 Nonrheumatic mitral (valve) insufficiency: Secondary | ICD-10-CM

## 2018-01-31 DIAGNOSIS — R748 Abnormal levels of other serum enzymes: Secondary | ICD-10-CM

## 2018-01-31 DIAGNOSIS — J90 Pleural effusion, not elsewhere classified: Secondary | ICD-10-CM

## 2018-01-31 DIAGNOSIS — R918 Other nonspecific abnormal finding of lung field: Secondary | ICD-10-CM

## 2018-01-31 DIAGNOSIS — I5023 Acute on chronic systolic (congestive) heart failure: Secondary | ICD-10-CM

## 2018-01-31 LAB — GLUCOSE, CAPILLARY
GLUCOSE-CAPILLARY: 166 mg/dL — AB (ref 70–99)
Glucose-Capillary: 96 mg/dL (ref 70–99)
Glucose-Capillary: 111 mg/dL — ABNORMAL HIGH (ref 70–99)
Glucose-Capillary: 99 mg/dL (ref 70–99)
Glucose-Capillary: 92 mg/dL (ref 70–99)

## 2018-01-31 LAB — COMPREHENSIVE METABOLIC PANEL
ALBUMIN: 3.3 g/dL — AB (ref 3.5–5.0)
ALK PHOS: 52 U/L (ref 38–126)
ALT: 19 U/L (ref 0–44)
AST: 23 U/L (ref 15–41)
Anion gap: 11 (ref 5–15)
BUN: 15 mg/dL (ref 8–23)
CALCIUM: 9 mg/dL (ref 8.9–10.3)
CO2: 26 mmol/L (ref 22–32)
CREATININE: 0.87 mg/dL (ref 0.44–1.00)
Chloride: 103 mmol/L (ref 98–111)
GFR calc Af Amer: >60 mL/min (ref >60)
GFR calc non Af Amer: 59 mL/min — ABNORMAL LOW (ref >60)
GLUCOSE: 109 mg/dL — AB (ref 70–99)
Potassium: 4 mmol/L (ref 3.5–5.1)
Sodium: 140 mmol/L (ref 135–145)
Total Bilirubin: 1.2 mg/dL (ref 0.3–1.2)
Total Protein: 6.1 g/dL — ABNORMAL LOW (ref 6.5–8.1)

## 2018-01-31 LAB — ECHOCARDIOGRAM COMPLETE
HEIGHTINCHES: 62 in
Weight: 1788.37 oz

## 2018-01-31 LAB — CBC
HCT: 33.8 % — ABNORMAL LOW (ref 36.0–46.0)
Hemoglobin: 11 g/dL — ABNORMAL LOW (ref 12.0–15.0)
MCH: 29.7 pg (ref 26.0–34.0)
MCHC: 32.5 g/dL (ref 30.0–36.0)
MCV: 91.4 fL (ref 78.0–100.0)
Platelets: 229 10*3/uL (ref 150–400)
RBC: 3.7 MIL/uL — ABNORMAL LOW (ref 3.87–5.11)
RDW: 13.9 % (ref 11.5–15.5)
WBC: 7.9 10*3/uL (ref 4.0–10.5)

## 2018-01-31 LAB — T4, FREE: FREE T4: 1.36 ng/dL (ref 0.82–1.77)

## 2018-01-31 LAB — TSH: TSH: 0.374 u[IU]/mL (ref 0.350–4.500)

## 2018-01-31 LAB — TROPONIN I: Troponin I: 0.59 ng/mL (ref <0.03)

## 2018-01-31 LAB — HEMOGLOBIN A1C
Hgb A1c MFr Bld: 5.4 % (ref 4.8–5.6)
Mean Plasma Glucose: 108.28 mg/dL

## 2018-01-31 MED ORDER — INSULIN ASPART 100 UNIT/ML ~~LOC~~ SOLN
0.0000 [IU] | Freq: Three times a day (TID) | SUBCUTANEOUS | Status: DC
  Administered 2018-01-31: 2 [IU] via SUBCUTANEOUS

## 2018-01-31 MED ORDER — HYDROCHLOROTHIAZIDE 25 MG PO TABS
25.0000 mg | ORAL_TABLET | Freq: Every day | ORAL | Status: DC
  Administered 2018-01-31 – 2018-02-01 (×2): 25 mg via ORAL
  Filled 2018-01-31 (×2): qty 1

## 2018-01-31 MED ORDER — ENOXAPARIN SODIUM 40 MG/0.4ML ~~LOC~~ SOLN
40.0000 mg | SUBCUTANEOUS | Status: DC
  Administered 2018-01-31: 40 mg via SUBCUTANEOUS
  Filled 2018-01-31: qty 0.4

## 2018-01-31 MED ORDER — LIDOCAINE HCL 1 % IJ SOLN
INTRAMUSCULAR | Status: AC
  Filled 2018-01-31: qty 10

## 2018-01-31 NOTE — Evaluation (Signed)
SLP Cancellation Note  Patient Details Name: Katrina Sims MRN: [id number] DOB: 10/02/1931   Cancelled treatment:       Reason Eval/Treat Not Completed: Other (comment);Patient at procedure or test/unavailable(pt npo for thoracentesis)   Macario Golds 01/31/2018, 8:34 AM   Luanna Salk, [facility] SLP [phone]

## 2018-01-31 NOTE — Evaluation (Signed)
Clinical/Bedside Swallow Evaluation Patient Details  Name: Katrina Sims MRN: [id number] Date of Birth: Mar 30, 1932  Today's Date: 01/31/2018 Time: SLP Start Time (ACUTE ONLY): 4098 SLP Stop Time (ACUTE ONLY): 1655 SLP Time Calculation (min) (ACUTE ONLY): 45 min  Past Medical History: History reviewed. No pertinent past medical history. Past Surgical History: History reviewed. No pertinent surgical history. HPI:  Pt is an 84 yo female adm to [facility] with fatigue and shortness of breath.  Pt found to have bilateral pleural effusions, multiple lung nodules, adneopathy, positive troponins.  Order for swallow eval received.  Pt admits to some difficulties swallowing foods more than liquids.  Pt has h/o breast cancer s/p chemo and radiation approximately 9 years ago.   Assessment / Plan / Recommendation Clinical Impression  Pt presents with functional oropharyngeal swallow based on clinical swallow evaluation.  No indication of airway compromise with po observed (water, pudding, graham cracker).  Pt easily passed 3 ounce water test and denies issues with swallowing food offered.  Pt admits to issues with swallowing that occur on occasion only with food or pills only.  She contributes this dysphagia to her dentures.  States she eats soft foods to compensate and denies her weight loss being du eto dysphagia.  Pt points to suprasternal notch to indicate area of sensation of stasis with hard foods/breads, etc.  She advised it's a "work out" to help her to clear.  Advised without neuro hx nor difficulties with liquids - doubt oropharyngeal dysphagia.  However with h/o breast cancer with radiation - suspect symptoms are due to esophageal dysphagia.    Pt advised she is managing her dysphagia fine and does not desire further work up.  Son present and agreeable to plan.   Ipad interpreter used with interpreter number [id number] during last half of the assessment.  No SLP follow up needed as pt agreeable to monitor  closely and alert MD if having more problems swallowing.     SLP Visit Diagnosis: Dysphagia, unspecified (R13.10)    Aspiration Risk  Mild aspiration risk    Diet Recommendation Regular;Thin liquid   Liquid Administration via: Cup;Straw Medication Administration: (as tolerated) Supervision: Patient able to self feed Compensations: Slow rate;Small sips/bites Postural Changes: Seated upright at 90 degrees    Other  Recommendations Oral Care Recommendations: Oral care BID   Follow up Recommendations None      Frequency and Duration      n/a      Prognosis    n/a    Swallow Study   General Date of Onset: 01/31/18 HPI: Pt is an 84 yo female adm to [facility] with fatigue and shortness of breath.  Pt found to have bilateral pleural effusions, multiple lung nodules, adneopathy, positive troponins.  Order for swallow eval received.  Pt admits to some difficulties swallowing foods more than liquids.  Pt has h/o breast cancer s/p chemo and radiation approximately 9 years ago. Type of Study: Bedside Swallow Evaluation Diet Prior to this Study: Regular;Thin liquids Temperature Spikes Noted: No Respiratory Status: Room air History of Recent Intubation: No Behavior/Cognition: Alert;Cooperative;Pleasant mood Oral Cavity Assessment: Within Functional Limits Oral Cavity - Dentition: Dentures, bottom;Dentures, top Vision: Functional for self-feeding Self-Feeding Abilities: Able to feed self Patient Positioning: Upright in bed Baseline Vocal Quality: Normal Volitional Cough: Strong Volitional Swallow: Able to elicit    Oral/Motor/Sensory Function Overall Oral Motor/Sensory Function: Within functional limits   Ice Chips Ice chips: Not tested   Thin Liquid Thin Liquid: Within functional limits Presentation:  Cup Other Comments: pt readily passed 3 ounce water test without difficulties    Nectar Thick Nectar Thick Liquid: Not tested   Honey Thick Honey Thick Liquid: Not tested   Puree Puree:  Within functional limits Presentation: Self Fed;Spoon   Solid     Solid: Within functional limits Presentation: Self Fredirick Lathe 01/31/2018,5:31 PM  Luanna Salk, [place] [facility] SLP [phone]

## 2018-01-31 NOTE — Progress Notes (Signed)
CRITICAL VALUE ALERT  Critical Value:  Troponin 0.59  Date & Time Notied:  01/31/2018  Provider Notified: On Call, B[place]heimer   Orders Received/Actions taken:

## 2018-01-31 NOTE — Progress Notes (Signed)
PT Cancellation Note  Patient Details Name: Katrina Sims MRN: [id number] DOB: [birth year]   Cancelled Treatment:    Reason Eval/Treat Not Completed: Patient at procedure or test/unavailable;Medical issues which prohibited therapy - Pt with up-trending troponins, and pt to have thoracentesis procedure shortly. Deferred by RN at this time, will check back as schedule permits and as pt's troponins are monitored.   Julien Girt, PT, DPT  Pager # [phone]    Katrina Sims 01/31/2018, 11:30 AM

## 2018-01-31 NOTE — Progress Notes (Signed)
Patient ID: Katrina Sims, female   DOB: [birth year], 84 y.o.   MRN: [id number] Request received for bilateral thoracenteses on patient.  Limited ultrasound of both right and left pleural spaces today revealed small effusions.  Case discussed with ordering MD and decision made to hold on procedure today; will reassess in few days. Pt aware.

## 2018-01-31 NOTE — Progress Notes (Signed)
  Echocardiogram 2D Echocardiogram has been performed.  Darlina Sicilian M 01/31/2018, 12:37 PM

## 2018-01-31 NOTE — Progress Notes (Signed)
Patient returned from [place].  Stable at this time.  Family at bedside.  Will continue to monitor.

## 2018-01-31 NOTE — Progress Notes (Signed)
PROGRESS NOTE    Katrina Sims  VFI:[id number] DOB: 1932-02-10 DOA: 01/30/2018 PCP: Patient, No Pcp Per    Brief Narrative:  84 year old female who presented with dyspnea.  She does have significant past medical history for coronary artery disease status post CABG, hypertension, dyslipidemia, type 2 diabetes mellitus and breast cancer in remission.  Reported 48 hours of dyspnea and fatigue, associated with dysphasia and unintentional weight loss.  On the initial physical examination blood pressure 134/64, heart rate 95, respiratory 28, temperature 97.7, oxygen saturation 98%, moist mucous membranes, lungs with decreased breath sounds bilaterally with by basilar rales, no wheezing, heart S1-S2 present rhythmic, abdomen soft nontender, no lower extremity edema.  Sodium 142, potassium 4.0, chloride 105, bicarb 27, glucose 111, BUN 15, creatinine 0.84, BNP 629, white count 7.9, hemoglobin 11.3, hematocrit 35.2, platelets 243, TSH 0.374, chest x-ray with hyperinflation, bibasilar atelectasis, no infiltrates.  CT chest negative for pulmonary embolism, multiple mostly subcentimeter pulmonary nodules scattered bilaterally, multifocal thoracic adenopathy, diffusely enlarged thyroid masses, largest on the right measuring 3.1 x 2.7 cm.  EKG, sinus, first-degree AV block, left axis deviation, poor R wave progression, positive interventricular conduction delay.  Patient was admitted to the hospital with a working diagnosis of dyspnea due to aspiration complicated by diffuse subcentimeter pulmonary nodules and small bilateral pleural effusions.   Assessment & Plan:   Active Problems:   Bilateral pleural effusion   Lung nodules   Adenopathy   Elevated troponin   HTN (hypertension)   Diabetes mellitus type 2, controlled ([place])   1. Dyspnea due to cardiogenic pulmonary edema. Clinical picture consistent with pulmonary edema, this am more euvlemic after IV furosemide, not documented urine output. Will  resume HCTZ and will follow on echocardiogram, small bilateral effusion with not enough fluid to be tapped. She does have an abnormal ekg with left axis deviation and interventricular conduction delay.   2. Bilateral mainly sub-centimeters pulmonary nodules. Unclear chronicity, likely not symptomatic, will need further work up with PET CT. I discussed the case with her son at the bedside and he will arrange follow up for her back home with her medical team.    3.  Thyroid nodule. TSH within normal range, patient asymptomatic, will have follow up with her medical team back, home, case discussed with her son at the bedside.   4. HTN. Will continue felodipine and will resume HCTZ. Systolic blood pressure 416 mmHg.   5. COPD. Positive emphysematous changes on CT of the chest, continue oxymetry monitoring and as needed bronchodilator.  6. T2DM. Will continue glucose cover and monitoring with insulin sliding scale.   DVT prophylaxis: enoxaparin    Code Status: DNR  Family Communication:I spoke with patient's son at the bedside and all questions were addressed.   Disposition Plan/ discharge barriers: pending clinical improvement and echocardiogram.    Consultants:     Procedures:     Antimicrobials:       Subjective: Dyspnea has improved but not back to baseline, no chest pain, no nausea or vomiting. Had mild edema in her lower extremities.   Objective: Vitals:   01/30/18 1753 01/30/18 2047 01/30/18 2201 01/31/18 0657  BP: (!) 155/65  124/71 122/66  Pulse: 92  84 85  Resp: 18  (!) 22 18  Temp:   98.6 F (37 C) (!) 97.4 F (36.3 C)  TempSrc:   Oral Oral  SpO2: 97%  93% 93%  Weight:  52.3 kg  50.7 kg  Height:  5\' 2"  (1.575  m)     No intake or output data in the 24 hours ending 01/31/18 1200 Filed Weights   01/30/18 2047 01/31/18 0657  Weight: 52.3 kg 50.7 kg    Examination:   General: Not in pain or dyspnea. Deconditioned  Neurology: Awake and alert, non focal  E  ENT: mild pallor, no icterus, oral mucosa moist Cardiovascular: No JVD. S1-S2 present, rhythmic, no gallops or rubs, positive murmur at the apex 3/6. Radiated to the axilla. No lower extremity edema. Pulmonary: decreased breath sounds bilaterally at bases, adequate air movement, no wheezing or rhonchi, positive bilateral rales. Gastrointestinal. Abdomen with no organomegaly, non tender, no rebound or guarding Skin. No rashes Musculoskeletal: no joint deformities     Data Reviewed: I have personally reviewed following labs and imaging studies  CBC: Recent Labs  Lab 01/30/18 1339 01/31/18 0132  WBC 7.9 7.9  NEUTROABS 5.3  --   HGB 11.3* 11.0*  HCT 35.2* 33.8*  MCV 91.4 91.4  PLT 243 536   Basic Metabolic Panel: Recent Labs  Lab 01/30/18 1339 01/31/18 0132  NA 142 140  K 4.0 4.0  CL 105 103  CO2 27 26  GLUCOSE 111* 109*  BUN 15 15  CREATININE 0.84 0.87  CALCIUM 9.2 9.0   GFR: Estimated Creatinine Clearance: 38.1 mL/min (by C-G formula based on SCr of 0.87 mg/dL). Liver Function Tests: Recent Labs  Lab 01/31/18 0132  AST 23  ALT 19  ALKPHOS 52  BILITOT 1.2  PROT 6.1*  ALBUMIN 3.3*   No results for input(s): LIPASE, AMYLASE in the last 168 hours. No results for input(s): AMMONIA in the last 168 hours. Coagulation Profile: No results for input(s): INR, PROTIME in the last 168 hours. Cardiac Enzymes: Recent Labs  Lab 01/30/18 1906 01/31/18 0132  TROPONINI 0.52* 0.59*   BNP (last 3 results) No results for input(s): PROBNP in the last 8760 hours. HbA1C: Recent Labs    01/31/18 0132  HGBA1C 5.4   CBG: Recent Labs  Lab 01/30/18 1802 01/31/18 0111 01/31/18 0754  GLUCAP 111* 96 111*   Lipid Profile: No results for input(s): CHOL, HDL, LDLCALC, TRIG, CHOLHDL, LDLDIRECT in the last 72 hours. Thyroid Function Tests: Recent Labs    01/31/18 0132  TSH 0.374  FREET4 1.36   Anemia Panel: No results for input(s): VITAMINB12, FOLATE, FERRITIN, TIBC,  IRON, RETICCTPCT in the last 72 hours.    Radiology Studies: I have reviewed all of the imaging during this hospital visit personally     Scheduled Meds: . atorvastatin  80 mg Oral q1800  . felodipine  2.5 mg Oral Daily  . furosemide  40 mg Intravenous Q12H  . insulin aspart  0-5 Units Subcutaneous QHS  . insulin aspart  0-9 Units Subcutaneous TID WC  . pantoprazole  40 mg Oral Daily   Continuous Infusions:   LOS: 1 day        Verl Whitmore Gerome Apley, MD [facility] Pager [phone]

## 2018-02-01 DIAGNOSIS — I5033 Acute on chronic diastolic (congestive) heart failure: Secondary | ICD-10-CM

## 2018-02-01 DIAGNOSIS — I350 Nonrheumatic aortic (valve) stenosis: Secondary | ICD-10-CM

## 2018-02-01 DIAGNOSIS — E118 Type 2 diabetes mellitus with unspecified complications: Secondary | ICD-10-CM

## 2018-02-01 LAB — BASIC METABOLIC PANEL
Anion gap: 11 (ref 5–15)
BUN: 19 mg/dL (ref 8–23)
CALCIUM: 8.8 mg/dL — AB (ref 8.9–10.3)
CHLORIDE: 101 mmol/L (ref 98–111)
CO2: 26 mmol/L (ref 22–32)
CREATININE: 0.94 mg/dL (ref 0.44–1.00)
GFR calc non Af Amer: 54 mL/min — ABNORMAL LOW (ref >60)
GLUCOSE: 118 mg/dL — AB (ref 70–99)
Potassium: 4.5 mmol/L (ref 3.5–5.1)
Sodium: 138 mmol/L (ref 135–145)

## 2018-02-01 LAB — TROPONIN I: Troponin I: 0.31 ng/mL (ref <0.03)

## 2018-02-01 LAB — T3, FREE: T3, Free: 2.6 pg/mL (ref 2.0–4.4)

## 2018-02-01 LAB — GLUCOSE, CAPILLARY: GLUCOSE-CAPILLARY: 116 mg/dL — AB (ref 70–99)

## 2018-02-01 MED ORDER — POTASSIUM CHLORIDE CRYS ER 20 MEQ PO TBCR: 20.0000 meq | EXTENDED_RELEASE_TABLET | Freq: Every day | ORAL | Status: DC | PRN

## 2018-02-01 MED ORDER — FUROSEMIDE 20 MG PO TABS: 20.0000 mg | ORAL_TABLET | Freq: Every day | ORAL | Status: DC | PRN

## 2018-02-01 MED ORDER — POTASSIUM CHLORIDE 20 MEQ PO PACK: 20.0000 meq | PACK | Freq: Every day | ORAL | 0 refills | Status: AC | PRN

## 2018-02-01 MED ORDER — POTASSIUM CHLORIDE 20 MEQ PO PACK
20.0000 meq | PACK | Freq: Every day | ORAL | Status: DC | PRN
  Filled 2018-02-01: qty 1

## 2018-02-01 MED ORDER — FUROSEMIDE 20 MG PO TABS: 20.0000 mg | ORAL_TABLET | Freq: Every day | ORAL | 0 refills | Status: DC | PRN

## 2018-02-01 NOTE — Discharge Summary (Addendum)
Physician Discharge Summary  Glenda Spelman VOZ:[id number] DOB: Feb 07, 1932 DOA: 01/30/2018  PCP: Patient, No Pcp Per  Admit date: 01/30/2018 Discharge date: 02/01/2018  Admitted From: Home  Disposition:  Home  Recommendations for Outpatient Follow-up and new medication changes:  1. Follow up with Urgent Care if persistent symptoms despite furosemide 2. Patient was instructed to take furosemide 20 mg daily for three days in case of dyspnea or edema, if persistent symptoms to seek attention at Urgent Clinic. 3. Take potassium only if taking furosemide. 4. Need to follow up with personal physician in [place], to follow up on lung nodules and thyroid nodule.   Home Health: no   Equipment/Devices: no    Discharge Condition: stable  CODE STATUS: DNR   Diet recommendation:  Heart healthy, salt restricted less than 2 grams per day.   Brief/Interim Summary: 84 year old female who presented with dyspnea.  She does have significant past medical history for coronary artery disease status post CABG, aortic stenosis, hypertension, dyslipidemia, type 2 diabetes mellitus and breast cancer in remission.  Reported 48 hours of dyspnea and fatigue, associated with dysphasia and unintentional weight loss.  On the initial physical examination blood pressure 134/64, heart rate 95, respiratory 28, temperature 97.7, oxygen saturation 98%, moist mucous membranes, lungs with decreased breath sounds bilaterally with by basilar rales, no wheezing, heart S1-S2 present rhythmic, positive systolic murmur 3/6 at the base, abdomen soft nontender, no lower extremity edema.  Sodium 142, potassium 4.0, chloride 105, bicarb 27, glucose 111, BUN 15, creatinine 0.84, BNP 629, white count 7.9, hemoglobin 11.3, hematocrit 35.2, platelets 243, TSH 0.374, chest x-ray with hyperinflation, bibasilar atelectasis, no infiltrates.  CT chest negative for pulmonary embolism, multiple mostly subcentimeter pulmonary nodules scattered  bilaterally, multifocal thoracic adenopathy, diffusely enlarged thyroid masses, largest on the right measuring 3.1 x 2.7 cm.  EKG, sinus, first-degree AV block, left axis deviation, poor R wave progression, positive interventricular conduction delay.  Patient was admitted to the hospital with a working diagnosis of dyspnea due to cardiogenic pulmonary edema complicated by diffuse subcentimeter pulmonary nodules and small bilateral pleural effusions  1.  Acute on chronic diastolic heart failure exacerbation related to critical aortic stenosis, complicated by cardiogenic pulmonary edema.  Patient was admitted to the medical unit, she was placed on a remote telemetry monitor, received IV furosemide for diuresis with significant improvement of her symptoms.  Further work-up with echocardiography showed a left ventricular ejection fraction 60 to 65%, with no wall motion abnormalities, her aortic valve was severely calcified, valve mobility was restricted, critical stenosis, valve area 0.25 cm,  peak velocity 0.15 with a mean gradient 56mmHg and peak gradient 118 mmHg.   Apparently patient declined valve replacement in the past.  I explained her and her family about progressive features of aortic stenosis, existence of alternative procedures like TAVR.  They have decided to follow-up as an outpatient with her personal physicians in [place].  Patient was instructed to take your furosemide as needed in case of the dyspnea, edema or weight gain 3 pounds in 24 hours or 5 pounds in 7 days.  2.  Bilateral mainly sub-centimeters pulmonary nodules.  Patient was incidentally diagnosed with multiple pulmonary nodules bilaterally, the largest measuring approximately 1 cm.  Recommendation for PET CT.  This will be pursued as an outpatient with her physicians in [place].  3.  Thyroid nodule.  Incidental finding of a thyroid nodule, largest measuring 3.1 x 2.7 cm, recommendations for further work-up as an outpatient.  4.  COPD.  Chronic and stable, no signs of exacerbation.  Oxygen saturation 93% on room air at discharge.  5.  Type 2 diabetes mellitus.  Patient will continue glucose control with metformin.  6.  Hypertension.  Blood pressures remain well controlled continue felodipine and hydrochlorothiazide.  Discharge Diagnoses:  Active Problems:   Bilateral pleural effusion   Lung nodules   Adenopathy   Elevated troponin   HTN (hypertension)   Diabetes mellitus type 2, controlled ([place])    Discharge Instructions   Allergies as of 02/01/2018   No Known Allergies     Medication List    TAKE these medications   amiloride-hydrochlorothiazide 5-50 MG tablet Commonly known as:  MODURETIC Take 0.5 tablets by mouth daily.   atorvastatin 80 MG tablet Commonly known as:  LIPITOR Take 80 mg by mouth daily.   felodipine 2.5 MG 24 hr tablet Commonly known as:  PLENDIL Take 2.5 mg by mouth daily.   furosemide 20 MG tablet Commonly known as:  LASIX Take 1 tablet (20 mg total) by mouth daily as needed for fluid or edema (take as needed for shortness of breath, edema or weight gain 3 lbs in 24 hours or 5lbs in 7 days.).   metFORMIN 850 MG tablet Commonly known as:  GLUCOPHAGE Take 850 mg by mouth 2 (two) times daily with a meal.   OMEPRAZOLE PO Take 1 capsule by mouth daily.   potassium chloride 20 MEQ packet Commonly known as:  KLOR-CON Take 20 mEq by mouth daily as needed (take only with fursemide (lasix)).   PRESCRIPTION MEDICATION Take 200 mg by mouth daily. [place] Cardio = ASA/Aluminum/Magnesium. Dosage forms 200/60/30 or 100/30/15 [place] product of Aspirin plus Antacid   VITAMIN B-6 PO Take 1 tablet by mouth daily.       No Known Allergies  Consultations:     Procedures/Studies: Dg Chest 2 View  Result Date: 01/30/2018 CLINICAL DATA:  Acute shortness of breath for 2 days. EXAM: CHEST - 2 VIEW COMPARISON:  None. FINDINGS: UPPER limits normal heart size and CABG  changes noted. Mild peribronchial thickening and mild hyperinflation noted. There is no evidence of focal airspace disease, pulmonary edema, suspicious pulmonary nodule/mass, pleural effusion, or pneumothorax. No acute bony abnormalities are identified. IMPRESSION: UPPER limits normal heart size without definite acute abnormality. Mild peribronchial thickening (likely chronic) and mild hyperinflation. Electronically Signed   By: Margarette Canada M.D.   On: 01/30/2018 13:28   Ct Angio Chest Pe W And/or Wo Contrast  Result Date: 01/30/2018 CLINICAL DATA:  Shortness of breath EXAM: CT ANGIOGRAPHY CHEST WITH CONTRAST TECHNIQUE: Multidetector CT imaging of the chest was performed using the standard protocol during bolus administration of intravenous contrast. Multiplanar CT image reconstructions and MIPs were obtained to evaluate the vascular anatomy. CONTRAST:  90 mL ISOVUE-370 IOPAMIDOL (ISOVUE-370) INJECTION 76% COMPARISON:  Chest radiograph January 30, 2018 FINDINGS: Cardiovascular: There is no demonstrable pulmonary embolus. There is no thoracic aortic aneurysm. No dissection evident, although the contrast bolus is less than optimal for assessment for potential thoracic aortic dissection. There are patchy areas of calcification in the proximal great vessels. Patchy calcification is also noted in mid great vessel regions. There is extensive aortic atherosclerosis throughout the aorta. There are foci of native coronary artery calcification. Patient is status post coronary artery bypass grafting. There is no pericardial effusion or pericardial thickening evident. The main pulmonary outflow tract measures 3.1 cm, prominent. Mediastinum/Nodes: There is diffuse thyroid enlargement with multiple masses arising from the thyroid,  largest measuring 3.1 x 2.7 cm on the right. There is adenopathy in the aortopulmonary window region with the largest lymph node measuring 1.9 x 1.1 cm. There is a lymph node to the left of the distal  trachea measuring 1.5 x 1.2 cm. There is extensive adenopathy in the subcarinal region with a conglomeration of lymph nodes measuring 3.2 x 2.2 cm. Lungs/Pleura: There are moderate free-flowing pleural effusions bilaterally. There are scattered areas of apparent lung scarring. There is a nodular opacity in the anterior segment of the right middle lobe measuring 9 x 7 mm, best seen on axial slice 86 series 6. There is a nodular opacity in the lateral segment of the right middle lobe measuring 9 x 8 mm, best seen on axial slice 97 series 6. There is a nodular opacity in the anterior segment of the left lower lobe, best seen on axial slice 98 series 6 measuring 10 x 9 mm. There is a nodular opacity in the lateral segment of the left lower lobe seen on axial slice 332 series 6 measuring 8 x 7 mm. There is an ill-defined nodular opacity in the inferior lingula best seen on axial slice 91 series 6 measuring 10 x 9 mm. There is an ill-defined nodular opacity in the lateral segment right lower lobe on axial slice 99 series 6 measuring 5 x 4 mm. There is patchy atelectatic change in both upper and lower lobes. There is no frank airspace consolidation. There is a degree of underlying centrilobular emphysema. Upper Abdomen: There is upper abdominal atherosclerotic plaque and calcification. Visualized upper abdominal structures otherwise appear unremarkable. Adrenals and particular appear normal. Musculoskeletal: Patient is status post median sternotomy. No blastic or lytic bone lesions are evident. No chest wall lesions are appreciable. Review of the MIP images confirms the above findings. IMPRESSION: 1. No demonstrable pulmonary embolus. No thoracic aortic aneurysm or dissection. Note that the contrast bolus is less than optimal for assessment for potential dissection. There is aortic atherosclerosis. Patient is status post coronary artery bypass grafting. There are multiple foci of great vessel calcification. 2. Several  scattered pulmonary nodular lesions, largest measuring approximately 1 cm. This finding, in concert with the adenopathy, warrants further evaluation. Advise PET-CT examination to further assess given concern for neoplastic etiology. 3. Multifocal thoracic adenopathy, most marked in the subcarinal region or adenopathy measures 3.2 x 2.2 cm. Correlation with PET-CT examination is felt to be warranted in this circumstance. 4. Moderate free-flowing pleural effusions bilaterally. Areas of atelectatic change. No frank consolidation. Underlying centrilobular emphysematous change noted. 5. Prominence of the main pulmonary outflow tract, a finding felt to represent a degree of pulmonary arterial hypertension. 6. **An incidental finding of potential clinical significance has been found. Diffusely enlarged thyroid with dominant masses, largest on the right measuring 3.1 x 2.7 cm. Consider further evaluation with nonemergent thyroid ultrasound. If patient is clinically hyperthyroid, consider nuclear medicine thyroid uptake and scan.** it should be noted that thyroid neoplasm potentially could cause adenopathy and parenchymal lung nodular lesions. Aortic Atherosclerosis (ICD10-I70.0) and Emphysema (ICD10-J43.9). Electronically Signed   By: Lowella Grip III M.D.   On: 01/30/2018 15:07   [place] Chest (pleural Effusion)  Result Date: 01/31/2018 CLINICAL DATA:  84 y/o  F; bilateral pleural effusions. EXAM: CHEST ULTRASOUND COMPARISON:  01/30/2018 CT chest FINDINGS: Grayscale chest ultrasound demonstrates small bilateral pleural effusions which are stable when compared with the prior CT of the chest given differences in technique. No septations or debris are identified within the field  of view. IMPRESSION: Stable small bilateral pleural effusions given differences in technique. Electronically Signed   By: Kristine Garbe M.D.   On: 01/31/2018 13:35       Subjective: Patient is feeling better, back to her  baseline, no nausea or vomiting, no chest pain or further dyspnea.   Discharge Exam: Vitals:   01/31/18 2017 02/01/18 0443  BP: (!) 110/55 134/63  Pulse: 90 81  Resp: 18 16  Temp: 98.6 F (37 C) 97.6 F (36.4 C)  SpO2: 93% 95%   Vitals:   01/31/18 1358 01/31/18 2017 02/01/18 0443 02/01/18 0500  BP:  (!) 110/55 134/63   Pulse:  90 81   Resp:  18 16   Temp:  98.6 F (37 C) 97.6 F (36.4 C)   TempSrc:  Oral Oral   SpO2: 94% 93% 95%   Weight:    51.4 kg  Height:        General: Not in pain or dyspnea  Neurology: Awake and alert, non focal  E ENT: no pallor, no icterus, oral mucosa moist Cardiovascular: No JVD. S1-S2 present, rhythmic, no gallops, or rubs. Positive murmurs 3/6 systolic murmur at the base. No lower extremity edema. Pulmonary: vesicular breath sounds bilaterally, adequate air movement, no wheezing, rhonchi or rales. Gastrointestinal. Abdomen with no organomegaly, non tender, no rebound or guarding Skin. No rashes Musculoskeletal: no joint deformities   The results of significant diagnostics from this hospitalization (including imaging, microbiology, ancillary and laboratory) are listed below for reference.     Microbiology: No results found for this or any previous visit (from the past 240 hour(s)).   Labs: BNP (last 3 results) Recent Labs    01/30/18 1339  BNP 093.8*   Basic Metabolic Panel: Recent Labs  Lab 01/30/18 1339 01/31/18 0132 02/01/18 0510  NA 142 140 138  K 4.0 4.0 4.5  CL 105 103 101  CO2 27 26 26   GLUCOSE 111* 109* 118*  BUN 15 15 19   CREATININE 0.84 0.87 0.94  CALCIUM 9.2 9.0 8.8*   Liver Function Tests: Recent Labs  Lab 01/31/18 0132  AST 23  ALT 19  ALKPHOS 52  BILITOT 1.2  PROT 6.1*  ALBUMIN 3.3*   No results for input(s): LIPASE, AMYLASE in the last 168 hours. No results for input(s): AMMONIA in the last 168 hours. CBC: Recent Labs  Lab 01/30/18 1339 01/31/18 0132  WBC 7.9 7.9  NEUTROABS 5.3  --   HGB  11.3* 11.0*  HCT 35.2* 33.8*  MCV 91.4 91.4  PLT 243 229   Cardiac Enzymes: Recent Labs  Lab 01/30/18 1906 01/31/18 0132 02/01/18 0510  TROPONINI 0.52* 0.59* 0.31*   BNP: Invalid input(s): POCBNP CBG: Recent Labs  Lab 01/31/18 0754 01/31/18 1218 01/31/18 1627 01/31/18 2102 02/01/18 0748  GLUCAP 111* 99 166* 92 116*   D-Dimer Recent Labs    01/30/18 1339  DDIMER 0.86*   Hgb A1c Recent Labs    01/31/18 0132  HGBA1C 5.4   Lipid Profile No results for input(s): CHOL, HDL, LDLCALC, TRIG, CHOLHDL, LDLDIRECT in the last 72 hours. Thyroid function studies Recent Labs    01/31/18 0132  TSH 0.374  T3FREE 2.6   Anemia work up No results for input(s): VITAMINB12, FOLATE, FERRITIN, TIBC, IRON, RETICCTPCT in the last 72 hours. Urinalysis No results found for: COLORURINE, APPEARANCEUR, [place], [place], GLUCOSEU, HGBUR, BILIRUBINUR, KETONESUR, PROTEINUR, UROBILINOGEN, NITRITE, LEUKOCYTESUR Sepsis Labs Invalid input(s): PROCALCITONIN,  WBC,  LACTICIDVEN Microbiology No results found for this or  any previous visit (from the past 240 hour(s)).   Time coordinating discharge: 45 minutes  SIGNED:   Tawni Millers, MD  [facility] 02/01/2018, 11:14 AM Pager [phone]  If 7PM-7AM, please contact night-coverage [email or web address] Password [email or web address]

## 2018-02-01 NOTE — Care Management Note (Signed)
Case Management Note  Patient Details  Name: Katrina Sims MRN: [id number] Date of Birth: 05/23/1932  Subjective/Objective:   Pt admitted with bilateral pleural effusion.                Action/Plan:   Discharging home with family with no needs.    Expected Discharge Date:                  Expected Discharge Plan:  Home/Self Care  In-House Referral:     Discharge planning Services  CM Consult  Post Acute Care Choice:    Choice offered to:     DME Arranged:    DME Agency:     HH Arranged:    [place] Agency:     Status of Service:  Completed, signed off  If discussed at [employer] of Stay Meetings, dates discussed:    Additional CommentsPurcell Mouton, RN 02/01/2018, 11:26 AM

## 2018-02-01 NOTE — Progress Notes (Signed)
PT Cancellation Note  Patient Details Name: Katrina Sims MRN: [id number] DOB: [birth year]   Cancelled Treatment:    Reason Eval/Treat Not Completed: Patient declined, no reason specified - Pt to d/c shortly. Pt and nursing state no acute PT needs at this time.   Julien Girt, PT, DPT  Pager # [phone]     Verenis [place] D Kambrie Eddleman 02/01/2018, 11:58 AM

## 2018-02-11 ENCOUNTER — Emergency Department (HOSPITAL_COMMUNITY)
Admission: EM | Admit: 2018-02-11 | Discharge: 2018-02-12 | Disposition: A | Discharge status: Home / Self Care | Payer: PRIVATE HEALTH INSURANCE | Attending: Emergency Medicine | Admitting: Emergency Medicine

## 2018-02-11 ENCOUNTER — Emergency Department (HOSPITAL_COMMUNITY): Payer: PRIVATE HEALTH INSURANCE

## 2018-02-11 ENCOUNTER — Encounter (HOSPITAL_COMMUNITY): Payer: Self-pay | Admitting: *Deleted

## 2018-02-11 DIAGNOSIS — I110 Hypertensive heart disease with heart failure: Secondary | ICD-10-CM | POA: Diagnosis not present

## 2018-02-11 DIAGNOSIS — Z79899 Other long term (current) drug therapy: Secondary | ICD-10-CM | POA: Insufficient documentation

## 2018-02-11 DIAGNOSIS — Z7984 Long term (current) use of oral hypoglycemic drugs: Secondary | ICD-10-CM | POA: Insufficient documentation

## 2018-02-11 DIAGNOSIS — I509 Heart failure, unspecified: Secondary | ICD-10-CM | POA: Diagnosis not present

## 2018-02-11 DIAGNOSIS — R0600 Dyspnea, unspecified: Secondary | ICD-10-CM | POA: Diagnosis not present

## 2018-02-11 DIAGNOSIS — E119 Type 2 diabetes mellitus without complications: Secondary | ICD-10-CM | POA: Insufficient documentation

## 2018-02-11 HISTORY — DX: Nonrheumatic aortic (valve) stenosis: I35.0

## 2018-02-11 HISTORY — DX: Malignant (primary) neoplasm, unspecified: C80.1

## 2018-02-11 LAB — COMPREHENSIVE METABOLIC PANEL
ALBUMIN: 3.7 g/dL (ref 3.5–5.0)
ALK PHOS: 64 U/L (ref 38–126)
ALT: 28 U/L (ref 0–44)
AST: 30 U/L (ref 15–41)
Anion gap: 9 (ref 5–15)
BILIRUBIN TOTAL: 0.4 mg/dL (ref 0.3–1.2)
BUN: 19 mg/dL (ref 8–23)
CALCIUM: 9.7 mg/dL (ref 8.9–10.3)
CO2: 30 mmol/L (ref 22–32)
CREATININE: 0.91 mg/dL (ref 0.44–1.00)
Chloride: 104 mmol/L (ref 98–111)
GFR calc Af Amer: >60 mL/min (ref >60)
GFR calc non Af Amer: 56 mL/min — ABNORMAL LOW (ref >60)
GLUCOSE: 99 mg/dL (ref 70–99)
POTASSIUM: 3.9 mmol/L (ref 3.5–5.1)
Sodium: 143 mmol/L (ref 135–145)
TOTAL PROTEIN: 7 g/dL (ref 6.5–8.1)

## 2018-02-11 LAB — TROPONIN I: Troponin I: 0.03 ng/mL (ref <0.03)

## 2018-02-11 LAB — CBC
HEMATOCRIT: 35.1 % — AB (ref 36.0–46.0)
Hemoglobin: 11.5 g/dL — ABNORMAL LOW (ref 12.0–15.0)
MCH: 29.6 pg (ref 26.0–34.0)
MCHC: 32.8 g/dL (ref 30.0–36.0)
MCV: 90.2 fL (ref 78.0–100.0)
PLATELETS: 232 10*3/uL (ref 150–400)
RBC: 3.89 MIL/uL (ref 3.87–5.11)
RDW: 14.2 % (ref 11.5–15.5)
WBC: 6.5 10*3/uL (ref 4.0–10.5)

## 2018-02-11 LAB — BRAIN NATRIURETIC PEPTIDE: B Natriuretic Peptide: 509.6 pg/mL — ABNORMAL HIGH (ref 0.0–100.0)

## 2018-02-11 LAB — TSH: TSH: 0.24 u[IU]/mL — AB (ref 0.350–4.500)

## 2018-02-11 MED ORDER — FUROSEMIDE 20 MG PO TABS: 10.0000 mg | ORAL_TABLET | Freq: Every day | ORAL | 0 refills | Status: AC

## 2018-02-11 MED ORDER — FUROSEMIDE 10 MG/ML IJ SOLN
20.0000 mg | Freq: Once | INTRAMUSCULAR | Status: AC
  Administered 2018-02-11: 20 mg via INTRAVENOUS
  Filled 2018-02-11: qty 4

## 2018-02-11 MED ORDER — IPRATROPIUM-ALBUTEROL 0.5-2.5 (3) MG/3ML IN SOLN
3.0000 mL | Freq: Once | RESPIRATORY_TRACT | Status: AC
  Administered 2018-02-11: 3 mL via RESPIRATORY_TRACT
  Filled 2018-02-11: qty 3

## 2018-02-11 NOTE — ED Notes (Signed)
Patient transported to X-ray 

## 2018-02-11 NOTE — ED Provider Notes (Signed)
[facility] DEPT Provider Note   CSN: [id number] Arrival date & time: 02/11/18  1749     History   Chief Complaint Chief Complaint  Patient presents with  . Fatigue  . Back Pain  . Shortness of Breath    HPI Katrina Sims is a 84 y.o. female with a history of left breast cancer in 2000 s/p quadrantectomy and tamoxifen, CAD s/p CABg x3 who presents to the emergency department with a chief complaint of dyspnea on exertion.  She was recently admitted to the hospital from 8 18-8 20 for similar symptoms and was found to have acute on chronic CHF.  She was treated with Lasix and her symptoms resolved.  Her son reports that she was discharged with Lasix, but was only told to take the medication if her weight fluctuated over 3 pounds.  He reports that she has been checking her weight every day and is actually down to 50.4 kg, down from 51 kg yesterday.  She reports that she has been eating seafood and mashed potatoes, but has not had a salt heavy diet since she came to the [place] to visit her son and his family.  Her son reports that he became concerned today when she walked into a store to purchase an item and came back out and was very short of breath.  She also endorses some bilateral pain in the middle of her back.  No known trauma or injury.  She denies leg swelling, wheezing, cough, fever, chills, nausea, vomiting, diarrhea, or abdominal pain.  He reports that she is also seemed more fatigued over the last few days and has been falling asleep more easily while sitting on the couch in the middle of the day.  Symptoms are not worse after exertion.  No history of similar.  The history is provided by the patient and a relative. The history is limited by a language barrier. A language interpreter was used [place] ).    Past Medical History:  Diagnosis Date  . Aortic stenosis   . Cancer [facility])    breast    Patient Active Problem List   Diagnosis Date  Noted  . Bilateral pleural effusion 01/30/2018  . Lung nodules 01/30/2018  . Adenopathy 01/30/2018  . Elevated troponin 01/30/2018  . HTN (hypertension) 01/30/2018  . Diabetes mellitus type 2, controlled ([place]) 01/30/2018    Past Surgical History:  Procedure Laterality Date  . BREAST SURGERY    . CARDIAC SURGERY       OB History   None      Home Medications    Prior to Admission medications   Medication Sig Start Date End Date Taking? Authorizing Provider  amiloride-hydrochlorothiazide (MODURETIC) 5-50 MG tablet Take 0.5 tablets by mouth daily.   Yes [provider]  atorvastatin (LIPITOR) 80 MG tablet Take 80 mg by mouth daily.   Yes [provider]  felodipine (PLENDIL) 2.5 MG 24 hr tablet Take 2.5 mg by mouth daily.   Yes [provider]  metFORMIN (GLUCOPHAGE) 850 MG tablet Take 850 mg by mouth 2 (two) times daily with a meal.   Yes [provider]  OMEPRAZOLE PO Take 1 capsule by mouth daily.   Yes [provider]  PRESCRIPTION MEDICATION Take 200 mg by mouth daily. [place] Cardio = ASA/Aluminum/Magnesium. Dosage forms 200/60/30 or 100/30/15 [place] product of Aspirin plus Antacid   Yes [provider]  Pyridoxine HCl (VITAMIN B-6 PO) Take 1 tablet by mouth daily.  Yes [provider]  furosemide (LASIX) 20 MG tablet Take 0.5 tablets (10 mg total) by mouth daily. 02/11/18   Lem Peary A, PA-C  potassium chloride (KLOR-CON) 20 MEQ packet Take 20 mEq by mouth daily as needed (take only with fursemide (lasix)). 02/01/18   Arrien, Jimmy Picket, MD    Family History No family history on file.  Social History Social History   Tobacco Use  . Smoking status: Never Smoker  . Smokeless tobacco: Never Used  Substance Use Topics  . Alcohol use: Not on file  . Drug use: Not on file     Allergies   Patient has no known allergies.   Review of Systems Review of Systems  Constitutional: Positive for  fatigue. Negative for activity change, chills and fever.  Eyes: Negative for visual disturbance.  Respiratory: Positive for shortness of breath. Negative for cough and wheezing.   Cardiovascular: Negative for chest pain, palpitations and leg swelling.  Gastrointestinal: Negative for abdominal pain, anal bleeding, diarrhea and nausea.  Genitourinary: Negative for dysuria.  Musculoskeletal: Negative for back pain.  Skin: Negative for rash.  Allergic/Immunologic: Negative for immunocompromised state.  Neurological: Negative for dizziness, weakness, numbness and headaches.  Psychiatric/Behavioral: Negative for confusion.     Physical Exam Updated Vital Signs BP 134/78 (BP Location: Right Arm)   Pulse 87   Temp (!) 97.4 F (36.3 C) (Oral)   Resp 16   SpO2 98%   Physical Exam  Constitutional:  Non-toxic appearance. She does not appear ill. No distress.  Well-appearing elderly female.  HENT:  Head: Normocephalic.  Eyes: Conjunctivae are normal.  Neck: Neck supple. No JVD present.  Cardiovascular: Normal rate, regular rhythm and intact distal pulses. Exam reveals no gallop and no friction rub.  Murmur heard.  Systolic murmur is present. Pulmonary/Chest: Effort normal. No respiratory distress. She has no wheezes.  Crackles in the bilateral bases.  Abdominal: Soft. Bowel sounds are normal. She exhibits no distension. There is no tenderness. There is no guarding.  Musculoskeletal:       Right lower leg: She exhibits edema.       Left lower leg: She exhibits edema.  1+ pitting edema in the bilateral lower extremities.  Neurological: She is alert.  Skin: Skin is warm. No rash noted.  Psychiatric: Her behavior is normal.  Nursing note and vitals reviewed.  ED Treatments / Results  Labs (all labs ordered are listed, but only abnormal results are displayed) Labs Reviewed  BRAIN NATRIURETIC PEPTIDE - Abnormal; Notable for the following components:      Result Value   B Natriuretic  Peptide 509.6 (*)    All other components within normal limits  COMPREHENSIVE METABOLIC PANEL - Abnormal; Notable for the following components:   GFR calc non Af Amer 56 (*)    All other components within normal limits  CBC - Abnormal; Notable for the following components:   Hemoglobin 11.5 (*)    HCT 35.1 (*)    All other components within normal limits  TROPONIN I - Abnormal; Notable for the following components:   Troponin I 0.03 (*)    All other components within normal limits  TSH - Abnormal; Notable for the following components:   TSH 0.240 (*)    All other components within normal limits  BLOOD GAS, VENOUS - Abnormal; Notable for the following components:   Bicarbonate 29.8 (*)    Acid-Base Excess 5.2 (*)    All other components within normal limits  EKG EKG Interpretation  Date/Time:  Friday February 11 2018 19:59:51 EDT Ventricular Rate:  74 PR Interval:    QRS Duration: 119 QT Interval:  360 QTC Calculation: 400 R Axis:   -51 Text Interpretation:  Sinus or ectopic atrial rhythm Prolonged PR interval LVH with IVCD, LAD and secondary repol abnrm Anterior ST elevation, probably due to LVH No significant change since last tracing Confirmed by Merrily Pew [phone]) on 02/11/2018 9:28:09 PM   Radiology Dg Chest 2 View  Result Date: 02/11/2018 CLINICAL DATA:  Dyspnea EXAM: CHEST - 2 VIEW COMPARISON:  01/30/2018 FINDINGS: COPD with hyperinflation and pulmonary scarring. No acute infiltrate or effusion. Negative for heart failure. Prior CABG. Atherosclerotic aorta. IMPRESSION: COPD without acute cardiopulmonary abnormality. Electronically Signed   By: Franchot Gallo M.D.   On: 02/11/2018 19:47    Procedures Procedures (including critical care time)  Medications Ordered in ED Medications  furosemide (LASIX) injection 20 mg (20 mg Intravenous Given 02/11/18 2122)  ipratropium-albuterol (DUONEB) 0.5-2.5 (3) MG/3ML nebulizer solution 3 mL (3 mLs Nebulization Given 02/11/18  2145)     Initial Impression / Assessment and Plan / ED Course  I have reviewed the triage vital signs and the nursing notes.  Pertinent labs & imaging results that were available during my care of the patient were reviewed by me and considered in my medical decision making (see chart for details).     84 year old female with a history of left breast cancer in 2000 s/p quadrantectomy and tamoxifen, CAD s/p CABg x3 presenting from home with worsening dyspnea on exertion.  She was recently admitted to the hospital from 01/30/2018 to 02/01/2018 with similar symptoms and was found to have acute on chronic CHF.  She was given Lasix while she was hospitalized, but worse son reports that she has not had any doses of the medication since discharge because she was told not to take it unless her weight fluctuates more than 3 pounds.  EKG with LVH.  Troponin is elevated at 0.03, significantly improved from previous.  BNP is 510.  IV Lasix given.  The patient has avoided several times since Lasix was given.  She was ambulated on pulse ox and remains in the high 90s.  No dyspnea on exertion with ambulation.  She states that she is feeling much better.  Chest x-ray concerning for COPD?  However, the patient has never smoked.  DuoNeb given, but she reports minimal improvement.  The patient was discussed and evaluated along with Dr. Dayna Barker, attending physician.  The patient's son was also concerned about worsening fatigue and sleepiness over the last few days.  Blood gas with pH of 7.4, which is normal.  Will provide the patient with an ambulatory referral to cardiology since she is in the [place] for 2 more weeks prior to returning to [place].  Doubt ACS, pneumonia, or cardiac tamponade.  Strict return precautions given.  Encourage the patient to follow a low-salt diet and take 10 mg of Lasix for the next 3 days, small dose given her body habitus.  The patient is hemodynamically stable and in no acute distress.  A [place]  interpreter was used for all interactions.  She is safe for discharge to home with outpatient follow-up to cardiology.  Final Clinical Impressions(s) / ED Diagnoses   Final diagnoses:  Acute on chronic congestive heart failure, unspecified heart failure type [facility])    ED Discharge Orders         Ordered    Ambulatory referral to  Cardiology     02/11/18 2310    furosemide (LASIX) 20 MG tablet  Daily     02/11/18 2312           McDonaldLaymond Purser, PA-C 02/12/18 0038    Mesner, Corene Cornea, MD 02/12/18 0110

## 2018-02-11 NOTE — ED Notes (Signed)
ED Provider at bedside. 

## 2018-02-11 NOTE — ED Notes (Signed)
Bed: KF27 Expected date:  Expected time:  Means of arrival:  Comments: Pt in room

## 2018-02-11 NOTE — Discharge Instructions (Signed)
Obrigado por me permitir cuidar de voc hoje no Departamento de Careers information officer. Enviei um encaminhamento para a clnica de cardiologia para que eles telefonassem para agendar uma consulta de acompanhamento. Tambm inclu suas informaes de contato junto com esta documentao. Se voc no tiver notcias na tera-feira, pode ligar para agendar uma consulta de acompanhamento. Tome metade de um comprimido de Lasix diariamente durante os prximos 3 dias. Volte ao departamento de emergncia se desenvolver falta de ar com atividade, apesar de tomar Lasix, dor no peito, inchao significativo nas pernas ou outros sintomas relacionados.  Thank you for allowing me to care for you today in the Emergency Department.   I have sent a referral to the cardiology clinic to have them give you a call to schedule a follow-up appointment.  I have also included their contact information along with this paperwork.  If you do not hear from them on Tuesday, you can call to schedule follow-up appointment.  Take one half of a tablet of Lasix daily for the next 3 days.   Return to the emergency department if you develop shortness of breath with activity despite taking Lasix, chest pain, significant swelling in your legs, or other concerning symptoms.

## 2018-02-11 NOTE — ED Notes (Signed)
Ambulated Pt down hallway. Pt's oxygen saturation stayed at 98% on RA. Pt denied any shortness of breath.

## 2018-02-11 NOTE — ED Triage Notes (Signed)
Pt complains of fatigue and back pain  . Pt states the pain feels like it is around her lungs. Pt complains of shortness of breath with exertion. Pt states she has had these issues since being discharged from the hospital ~1 week ago. Pt's son is concerned about her making the long flight to [place] 2 weeks from now.  Pt speaks portuguese, interpreter used.

## 2018-02-12 NOTE — ED Notes (Signed)
Went over discharge instructions with Pt and family. Both verbalized understanding. Removed IV and Pt ambulated out of facility

## 2018-02-12 NOTE — ED Provider Notes (Signed)
Medical screening examination/treatment/procedure(s) were conducted as a shared visit with non-physician practitioner(s) and myself.  I personally evaluated the patient during the encounter.  Here with progressively worsening dyspnea on exertion since being discharged from the hospital.  Also some lower extremity swelling.  On my evaluation patient is Katrina Sims had Lasix urinated at least twice.  She states her breathing is much better.  Is no lower extremity edema but still has some bilateral rales.  No respiratory distress.  Labs consistent with likely CHF exacerbation.  Will ambulate and measure her pulse ox, if it is good and it is okay patient can likely be discharged follow with cardiology as an outpatient  EKG Interpretation  Date/Time:  Friday February 11 2018 19:59:51 EDT Ventricular Rate:  74 PR Interval:    QRS Duration: 119 QT Interval:  360 QTC Calculation: 400 R Axis:   -51 Text Interpretation:  Sinus or ectopic atrial rhythm Prolonged PR interval LVH with IVCD, LAD and secondary repol abnrm Anterior ST elevation, probably due to LVH No significant change since last tracing Confirmed by Merrily Pew [id number]) on 02/11/2018 9:28:09 PM     Katrina Sims, Corene Cornea, MD 02/12/18 0110

## 2018-02-16 LAB — BLOOD GAS, VENOUS
Acid-Base Excess: 5.2 mmol/L — ABNORMAL HIGH (ref 0.0–2.0)
Bicarbonate: 29.8 mmol/L — ABNORMAL HIGH (ref 20.0–28.0)
FIO2: 21
O2 SAT: 47 %
PATIENT TEMPERATURE: 97.4
PCO2 VEN: 47.3 mmHg (ref 44.0–60.0)
pH, Ven: 7.413 (ref 7.250–7.430)

## 2018-02-18 ENCOUNTER — Ambulatory Visit: Payer: PRIVATE HEALTH INSURANCE | Admitting: Cardiology

## 2018-02-22 ENCOUNTER — Encounter: Payer: Self-pay | Admitting: Cardiology

## 2018-02-22 ENCOUNTER — Ambulatory Visit (INDEPENDENT_AMBULATORY_CARE_PROVIDER_SITE_OTHER): Payer: PRIVATE HEALTH INSURANCE | Admitting: Cardiology

## 2018-02-22 VITALS — BP 118/64 | HR 84 | Ht 62.0 in | Wt 110.0 lb

## 2018-02-22 DIAGNOSIS — I350 Nonrheumatic aortic (valve) stenosis: Secondary | ICD-10-CM | POA: Diagnosis not present

## 2018-02-22 DIAGNOSIS — I5043 Acute on chronic combined systolic (congestive) and diastolic (congestive) heart failure: Secondary | ICD-10-CM

## 2018-02-22 DIAGNOSIS — R748 Abnormal levels of other serum enzymes: Secondary | ICD-10-CM

## 2018-02-22 DIAGNOSIS — I10 Essential (primary) hypertension: Secondary | ICD-10-CM

## 2018-02-22 DIAGNOSIS — R778 Other specified abnormalities of plasma proteins: Secondary | ICD-10-CM

## 2018-02-22 DIAGNOSIS — R7989 Other specified abnormal findings of blood chemistry: Secondary | ICD-10-CM

## 2018-02-22 NOTE — Progress Notes (Signed)
Cardiology Office Note:    Date:  02/22/2018   ID:  Katrina Sims, DOB 09-05-1931, MRN [id number]  PCP:  Patient, No Pcp Per  Cardiologist:  Jenean Lindau, MD   Referring MD: Joanne Gavel, PA-C    ASSESSMENT:    1. Critical aortic valve stenosis   2. Essential hypertension   3. Elevated troponin   4. Acute on chronic combined systolic and diastolic CHF (congestive heart failure) ([place])    PLAN:    In order of problems listed above:  1. Patient has critical aortic stenosis and has had episodes of congestive heart failure twice in the past month.  I recommended evaluation with coronary angiography and heart catheterization withTAVR as an possible roadmap for treatment the patient is vehemently against it.  She plans to go back home to [place] in the next few days.  I discussed with her the risks of flying with such critical problems including sudden cardiac death she vocalized understanding and understands this.  Also her family understands this.  She mentions to me that she has social and financial issues for which reasons she cannot do this.  She plans to go back and get it taken care of.  Discussed with her the precautions that she needs to take in the flight including diet which she needs to have salt regulated and also be ambulatory and not have prolonged periods of sitting during the flight to prevent the risk of deep venous thrombosis.  She will now be seen in follow-up on a as needed basis only.   Medication Adjustments/Labs and Tests Ordered: Current medicines are reviewed at length with the patient today.  Concerns regarding medicines are outlined above.  No orders of the defined types were placed in this encounter.  No orders of the defined types were placed in this encounter.    History of Present Illness:    Katrina Sims is a 84 y.o. female who is being seen today for the evaluation of critical aortic stenosis and congestive heart failure at the request of  McDonald, Mia A, PA-C.  Patient is a pleasant 84 year old female she is visiting here from [place].  She came to the hospital twice with shortness of breath and was found to be in congestive heart failure.  Echocardiogram has revealed critical aortic stenosis and she is sent here for follow-up.  She has history of breast cancer in remission.  She has had bypass surgery in the remote past.  She is accompanied by her son and daughter-in-law were very supportive.  At the time of my evaluation, the patient is alert awake oriented and in no distress.  Past Medical History:  Diagnosis Date  . Aortic stenosis   . Cancer [facility])    breast    Past Surgical History:  Procedure Laterality Date  . BREAST SURGERY    . CARDIAC SURGERY      Current Medications: Current Meds  Medication Sig  . amiloride-hydrochlorothiazide (MODURETIC) 5-50 MG tablet Take 1 tablet by mouth daily.   Marland Kitchen atorvastatin (LIPITOR) 80 MG tablet Take 80 mg by mouth daily.  . felodipine (PLENDIL) 2.5 MG 24 hr tablet Take 2.5 mg by mouth daily.  . furosemide (LASIX) 20 MG tablet Take 0.5 tablets (10 mg total) by mouth daily. (Patient taking differently: Take 10 mg by mouth as needed. )  . metFORMIN (GLUCOPHAGE) 850 MG tablet Take 850 mg by mouth 2 (two) times daily with a meal.  . OMEPRAZOLE PO Take 1  capsule by mouth daily.   Marland Kitchen PRESCRIPTION MEDICATION Take 200 mg by mouth daily. [place] Cardio = ASA/Aluminum/Magnesium. Dosage forms 200/60/30 or 100/30/15 [place] product of Aspirin plus Antacid  . Pyridoxine HCl (VITAMIN B-6 PO) Take 1 tablet by mouth daily.     Allergies:   Patient has no known allergies.   Social History   Socioeconomic History  . Marital status: Married    Spouse name: Not on file  . Number of children: Not on file  . Years of education: Not on file  . Highest education level: Not on file  Occupational History  . Not on file  Social Needs  . Financial resource strain: Not on file  . Food insecurity:     Worry: Not on file    Inability: Not on file  . Transportation needs:    Medical: Not on file    Non-medical: Not on file  Tobacco Use  . Smoking status: Never Smoker  . Smokeless tobacco: Never Used  Substance and Sexual Activity  . Alcohol use: Not on file  . Drug use: Not on file  . Sexual activity: Not on file  Lifestyle  . Physical activity:    Days per week: Not on file    Minutes per session: Not on file  . Stress: Not on file  Relationships  . Social connections:    Talks on phone: Not on file    Gets together: Not on file    Attends religious service: Not on file    Active member of club or organization: Not on file    Attends meetings of clubs or organizations: Not on file    Relationship status: Not on file  Other Topics Concern  . Not on file  Social History Narrative  . Not on file     Family History: The patient's family history is not on file.  ROS:   Please see the history of present illness.    All other systems reviewed and are negative.  EKGs/Labs/Other Studies Reviewed:    The following studies were reviewed today: I discussed the emergency room records reviewed with the patient at length.   Recent Labs: 02/11/2018: ALT 28; B Natriuretic Peptide 509.6; BUN 19; Creatinine, Ser 0.91; Hemoglobin 11.5; Platelets 232; Potassium 3.9; Sodium 143; TSH 0.240  Recent Lipid Panel No results found for: CHOL, TRIG, HDL, CHOLHDL, VLDL, LDLCALC, LDLDIRECT  Physical Exam:    VS:  BP 118/64 (BP Location: Right Arm, Patient Position: Sitting, Cuff Size: Normal)   Pulse 84   Ht 5\' 2"  (1.575 m)   Wt 110 lb (49.9 kg)   SpO2 96%   BMI 20.12 kg/m     Wt Readings from Last 3 Encounters:  02/22/18 110 lb (49.9 kg)  02/01/18 113 lb 5.1 oz (51.4 kg)     GEN: Patient is in no acute distress HEENT: Normal NECK: No JVD; No carotid bruits LYMPHATICS: No lymphadenopathy CARDIAC: S1 S2 regular, 2/6 systolic murmur at the apex. RESPIRATORY:  Clear to  auscultation without rales, wheezing or rhonchi  ABDOMEN: Soft, non-tender, non-distended MUSCULOSKELETAL:  No edema; No deformity  SKIN: Warm and dry NEUROLOGIC:  Alert and oriented x 3 PSYCHIATRIC:  Normal affect    Signed, Jenean Lindau, MD  02/22/2018 10:54 AM    [place]

## 2018-02-22 NOTE — Patient Instructions (Signed)
Medication Instructions:  Your physician recommends that you continue on your current medications as directed. Please refer to the Current Medication list given to you today.   Labwork: None  Testing/Procedures: None  Follow-Up: Your physician recommends that you schedule a follow-up appointment in: as needed   Any Other Special Instructions Will Be Listed Below (If Applicable).     If you need a refill on your cardiac medications before your next appointment, please call your pharmacy.   [facility]  Ashley A, RN, BSN  

## 2018-10-11 IMAGING — CR DG CHEST 2V
2 series · 2 of 2 positions shown · non-contrast
Comparison: 01/30/2018

CLINICAL DATA: Dyspnea

EXAM:
CHEST - 2 VIEW

[w chest lat]
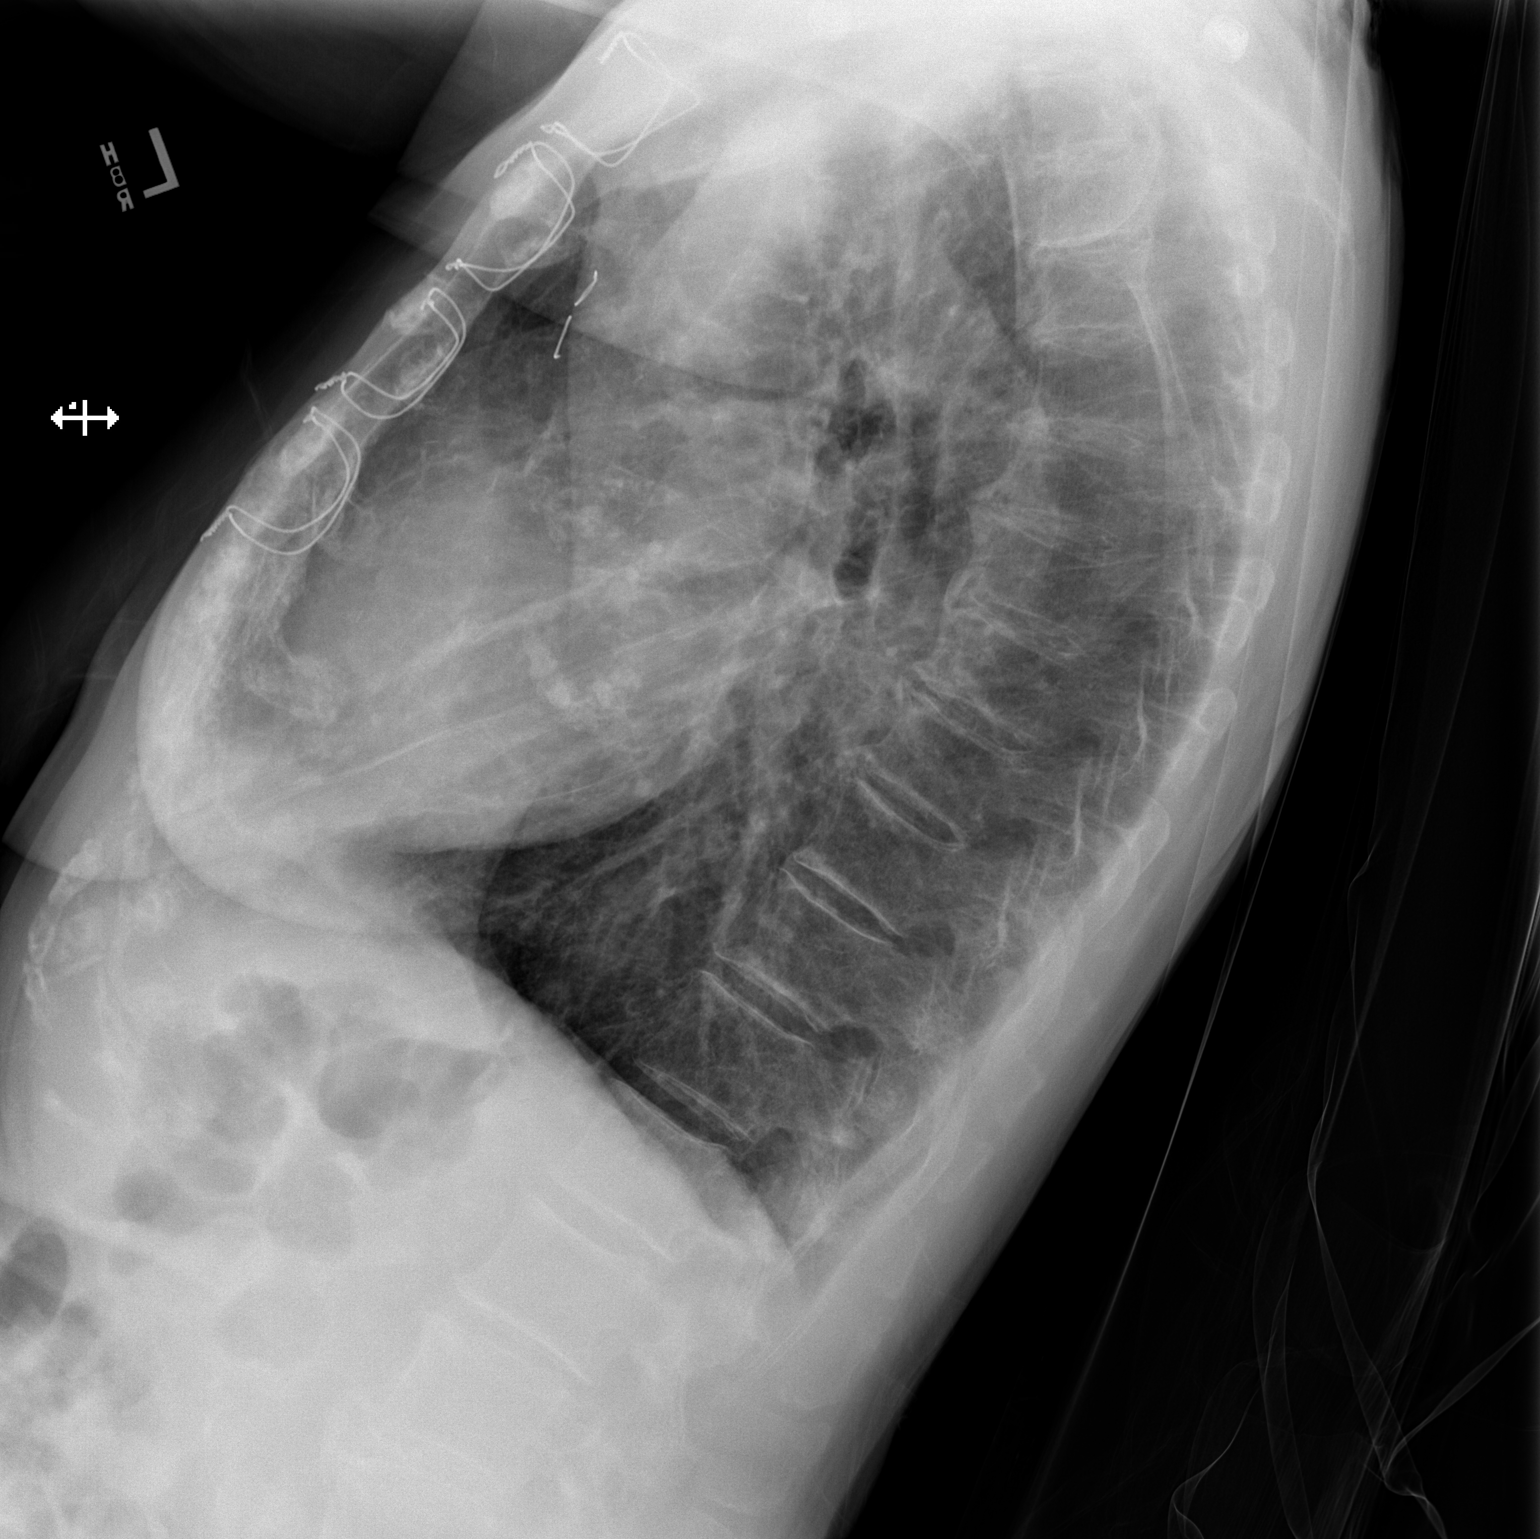

[x chest ap]
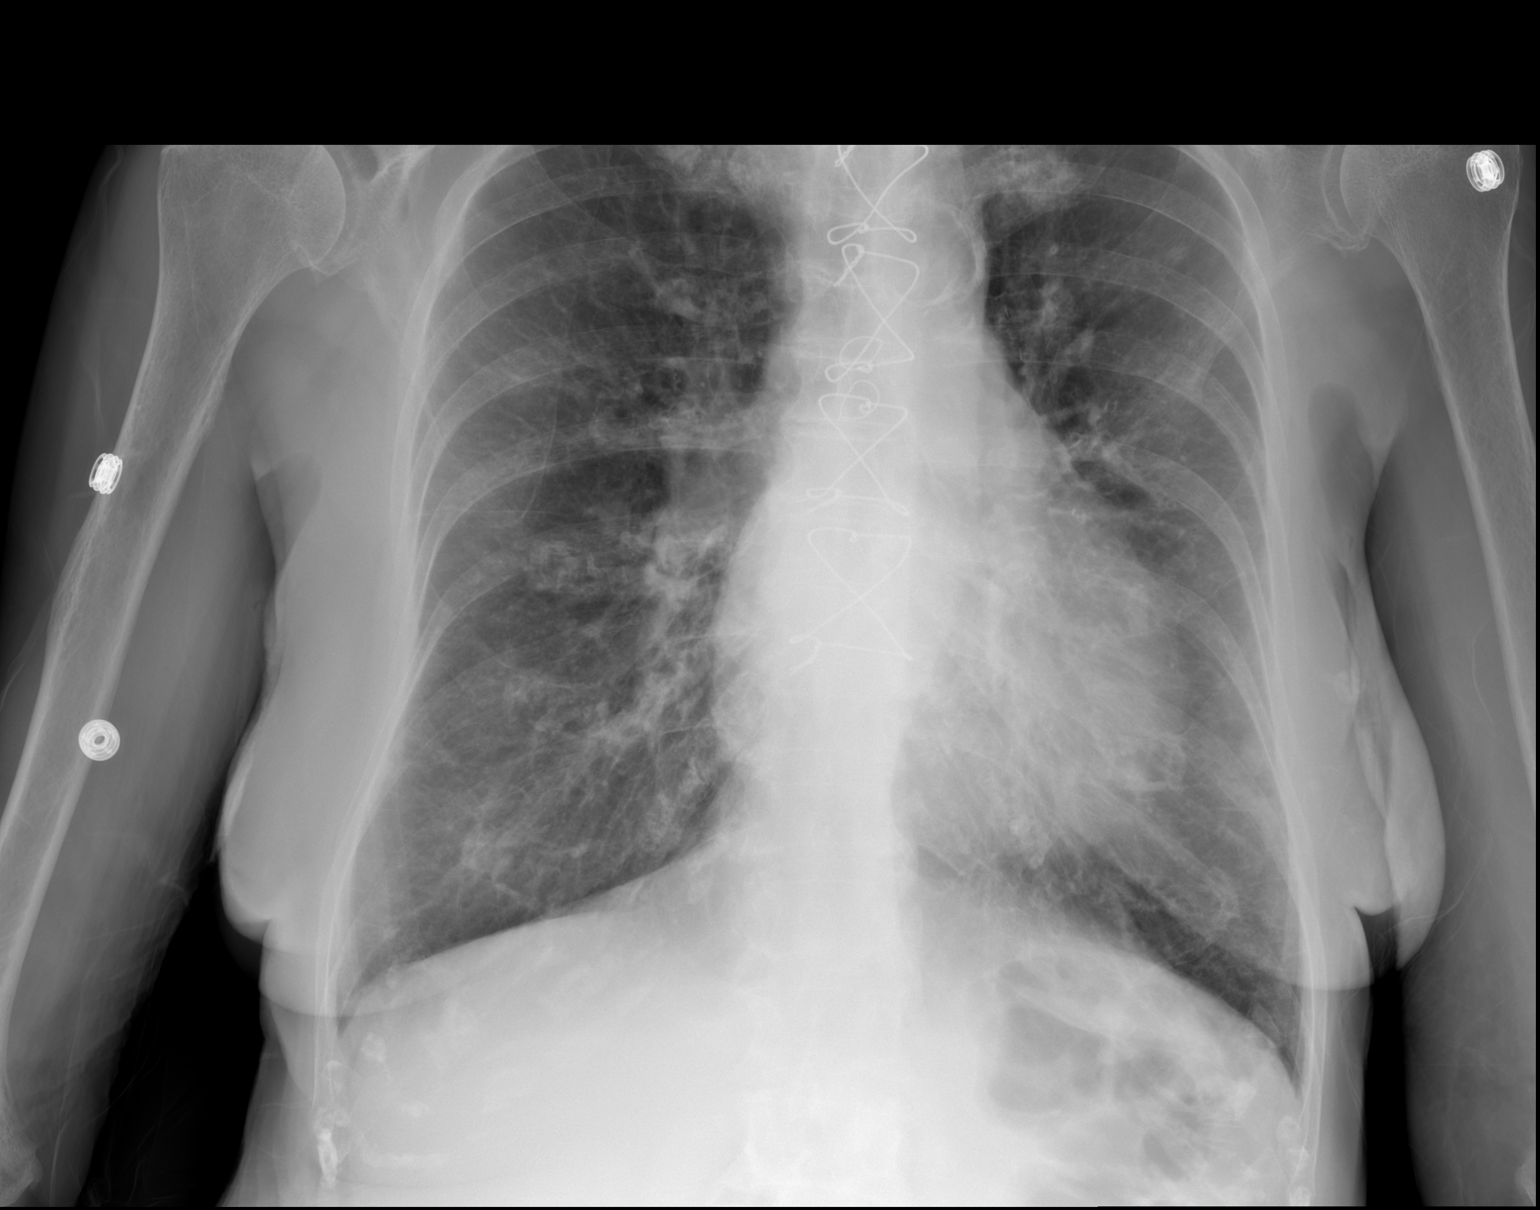

[2 of 2 positions shown; findings below may reference images not displayed]

FINDINGS: COPD with hyperinflation and pulmonary scarring. No acute infiltrate
or effusion. Negative for heart failure. Prior CABG. Atherosclerotic
aorta.
IMPRESSION: COPD without acute cardiopulmonary abnormality.
